# Patient Record
Sex: Female | Born: 1970 | Race: White | Hispanic: No | Smoking: Current every day smoker
Health system: Southern US, Community
[De-identification: ages and names within clinical notes are randomized; demographics above are authoritative.]

## PROBLEM LIST (undated history)

## (undated) DIAGNOSIS — J45909 Unspecified asthma, uncomplicated: Secondary | ICD-10-CM

## (undated) DIAGNOSIS — J449 Chronic obstructive pulmonary disease, unspecified: Secondary | ICD-10-CM

## (undated) HISTORY — PX: TUBAL LIGATION: SHX77

## (undated) HISTORY — PX: BACK SURGERY: SHX140

---

## 2001-05-16 ENCOUNTER — Emergency Department (HOSPITAL_COMMUNITY): Admission: EM | Admit: 2001-05-16 | Discharge: 2001-05-16 | Payer: Self-pay | Admitting: Emergency Medicine

## 2001-10-17 ENCOUNTER — Encounter: Payer: Self-pay | Admitting: Emergency Medicine

## 2001-10-17 ENCOUNTER — Emergency Department (HOSPITAL_COMMUNITY): Admission: EM | Admit: 2001-10-17 | Discharge: 2001-10-17 | Payer: Self-pay | Admitting: Emergency Medicine

## 2002-01-11 ENCOUNTER — Emergency Department (HOSPITAL_COMMUNITY): Admission: EM | Admit: 2002-01-11 | Discharge: 2002-01-11 | Payer: Self-pay | Admitting: Emergency Medicine

## 2002-01-11 ENCOUNTER — Encounter: Payer: Self-pay | Admitting: Emergency Medicine

## 2002-09-27 ENCOUNTER — Emergency Department (HOSPITAL_COMMUNITY): Admission: EM | Admit: 2002-09-27 | Discharge: 2002-09-27 | Payer: Self-pay | Admitting: Emergency Medicine

## 2003-04-10 ENCOUNTER — Emergency Department (HOSPITAL_COMMUNITY): Admission: EM | Admit: 2003-04-10 | Discharge: 2003-04-10 | Payer: Self-pay

## 2003-04-10 ENCOUNTER — Encounter: Payer: Self-pay | Admitting: Emergency Medicine

## 2003-06-16 ENCOUNTER — Emergency Department (HOSPITAL_COMMUNITY): Admission: EM | Admit: 2003-06-16 | Discharge: 2003-06-16 | Payer: Self-pay | Admitting: Emergency Medicine

## 2003-11-10 ENCOUNTER — Other Ambulatory Visit: Payer: Self-pay

## 2004-01-17 ENCOUNTER — Emergency Department (HOSPITAL_COMMUNITY): Admission: EM | Admit: 2004-01-17 | Discharge: 2004-01-18 | Payer: Self-pay | Admitting: Emergency Medicine

## 2004-01-26 ENCOUNTER — Emergency Department (HOSPITAL_COMMUNITY): Admission: EM | Admit: 2004-01-26 | Discharge: 2004-01-26 | Payer: Self-pay | Admitting: Emergency Medicine

## 2004-02-06 ENCOUNTER — Emergency Department (HOSPITAL_COMMUNITY): Admission: EM | Admit: 2004-02-06 | Discharge: 2004-02-06 | Payer: Self-pay | Admitting: Emergency Medicine

## 2004-08-05 ENCOUNTER — Emergency Department: Payer: Self-pay | Admitting: Emergency Medicine

## 2004-08-18 ENCOUNTER — Emergency Department: Payer: Self-pay | Admitting: Emergency Medicine

## 2004-08-25 ENCOUNTER — Emergency Department: Payer: Self-pay | Admitting: Emergency Medicine

## 2004-10-11 ENCOUNTER — Emergency Department (HOSPITAL_COMMUNITY): Admission: EM | Admit: 2004-10-11 | Discharge: 2004-10-11 | Payer: Self-pay | Admitting: *Deleted

## 2005-05-13 ENCOUNTER — Emergency Department: Payer: Self-pay | Admitting: Emergency Medicine

## 2005-10-01 ENCOUNTER — Emergency Department (HOSPITAL_COMMUNITY): Admission: EM | Admit: 2005-10-01 | Discharge: 2005-10-01 | Payer: Self-pay | Admitting: Emergency Medicine

## 2006-02-04 ENCOUNTER — Emergency Department: Payer: Self-pay | Admitting: Emergency Medicine

## 2006-02-04 ENCOUNTER — Other Ambulatory Visit: Payer: Self-pay

## 2006-02-08 ENCOUNTER — Ambulatory Visit: Payer: Self-pay | Admitting: Emergency Medicine

## 2006-02-08 ENCOUNTER — Emergency Department: Payer: Self-pay | Admitting: Emergency Medicine

## 2006-04-25 ENCOUNTER — Emergency Department: Payer: Self-pay | Admitting: Unknown Physician Specialty

## 2006-05-19 ENCOUNTER — Emergency Department: Payer: Self-pay | Admitting: Emergency Medicine

## 2006-08-28 ENCOUNTER — Emergency Department: Payer: Self-pay | Admitting: Emergency Medicine

## 2006-12-03 ENCOUNTER — Emergency Department: Payer: Self-pay | Admitting: Emergency Medicine

## 2007-06-08 ENCOUNTER — Emergency Department: Payer: Self-pay | Admitting: Emergency Medicine

## 2007-10-04 ENCOUNTER — Other Ambulatory Visit: Payer: Self-pay

## 2007-10-04 ENCOUNTER — Emergency Department: Payer: Self-pay | Admitting: Emergency Medicine

## 2008-04-19 ENCOUNTER — Emergency Department: Payer: Self-pay | Admitting: Emergency Medicine

## 2008-06-04 ENCOUNTER — Emergency Department: Payer: Self-pay | Admitting: Emergency Medicine

## 2008-07-18 ENCOUNTER — Emergency Department: Payer: Self-pay | Admitting: Internal Medicine

## 2008-12-02 ENCOUNTER — Emergency Department: Payer: Self-pay | Admitting: Emergency Medicine

## 2009-02-14 ENCOUNTER — Emergency Department: Payer: Self-pay | Admitting: Emergency Medicine

## 2009-05-17 ENCOUNTER — Emergency Department: Payer: Self-pay | Admitting: Emergency Medicine

## 2009-05-18 ENCOUNTER — Emergency Department: Payer: Self-pay | Admitting: Emergency Medicine

## 2009-06-23 ENCOUNTER — Emergency Department: Payer: Self-pay | Admitting: Internal Medicine

## 2009-09-01 ENCOUNTER — Emergency Department: Payer: Self-pay | Admitting: Emergency Medicine

## 2009-09-13 ENCOUNTER — Ambulatory Visit: Payer: Self-pay | Admitting: Family Medicine

## 2009-09-24 ENCOUNTER — Ambulatory Visit (HOSPITAL_COMMUNITY): Admission: RE | Admit: 2009-09-24 | Discharge: 2009-09-25 | Payer: Self-pay | Admitting: Neurosurgery

## 2009-12-19 ENCOUNTER — Emergency Department: Payer: Self-pay | Admitting: Emergency Medicine

## 2009-12-20 ENCOUNTER — Emergency Department: Payer: Self-pay | Admitting: Emergency Medicine

## 2010-04-05 ENCOUNTER — Emergency Department: Payer: Self-pay | Admitting: Emergency Medicine

## 2010-08-29 ENCOUNTER — Emergency Department: Payer: Self-pay | Admitting: Emergency Medicine

## 2010-11-10 LAB — CBC
HCT: 39.4 % (ref 36.0–46.0)
Hemoglobin: 13.6 g/dL (ref 12.0–15.0)
Platelets: 246 10*3/uL (ref 150–400)
RBC: 4.41 MIL/uL (ref 3.87–5.11)
RDW: 13.1 % (ref 11.5–15.5)
WBC: 7.1 10*3/uL (ref 4.0–10.5)

## 2010-11-10 LAB — URINALYSIS, ROUTINE W REFLEX MICROSCOPIC
Bilirubin Urine: NEGATIVE
Glucose, UA: NEGATIVE mg/dL

## 2010-11-10 LAB — BASIC METABOLIC PANEL
CO2: 30 mEq/L (ref 19–32)
Calcium: 8.8 mg/dL (ref 8.4–10.5)
Chloride: 105 mEq/L (ref 96–112)
GFR calc Af Amer: >60 mL/min (ref >60)
GFR calc non Af Amer: >60 mL/min (ref >60)
Glucose, Bld: 90 mg/dL (ref 70–99)
Potassium: 4.4 mEq/L (ref 3.5–5.1)
Sodium: 141 mEq/L (ref 135–145)

## 2010-11-10 LAB — URINE MICROSCOPIC-ADD ON

## 2010-11-29 ENCOUNTER — Emergency Department: Payer: Self-pay | Admitting: Emergency Medicine

## 2010-12-03 ENCOUNTER — Encounter: Payer: Self-pay | Admitting: Cardiovascular Disease

## 2011-03-01 ENCOUNTER — Emergency Department (HOSPITAL_COMMUNITY)
Admission: EM | Admit: 2011-03-01 | Discharge: 2011-03-01 | Disposition: A | Discharge status: Home / Self Care | Payer: BC Managed Care – HMO | Attending: Emergency Medicine | Admitting: Emergency Medicine

## 2011-03-01 DIAGNOSIS — M549 Dorsalgia, unspecified: Secondary | ICD-10-CM | POA: Insufficient documentation

## 2011-03-01 LAB — URINALYSIS, ROUTINE W REFLEX MICROSCOPIC
Hgb urine dipstick: NEGATIVE
Ketones, ur: NEGATIVE mg/dL
Leukocytes, UA: NEGATIVE
Protein, ur: NEGATIVE mg/dL

## 2011-06-23 ENCOUNTER — Emergency Department: Payer: Self-pay | Admitting: *Deleted

## 2011-10-04 ENCOUNTER — Emergency Department: Payer: Self-pay | Admitting: Emergency Medicine

## 2011-11-13 ENCOUNTER — Emergency Department: Payer: Self-pay | Admitting: Internal Medicine

## 2011-11-13 LAB — URINALYSIS, COMPLETE
Bilirubin,UR: NEGATIVE
Leukocyte Esterase: NEGATIVE
Ph: 6 (ref 4.5–8.0)
Protein: NEGATIVE
Squamous Epithelial: 1

## 2011-11-13 LAB — PREGNANCY, URINE: Pregnancy Test, Urine: NEGATIVE m[IU]/mL

## 2013-09-20 ENCOUNTER — Emergency Department: Payer: Self-pay | Admitting: Internal Medicine

## 2013-12-20 ENCOUNTER — Emergency Department: Payer: Self-pay | Admitting: Internal Medicine

## 2014-02-09 ENCOUNTER — Emergency Department: Payer: Self-pay | Admitting: Emergency Medicine

## 2014-05-30 ENCOUNTER — Emergency Department (HOSPITAL_COMMUNITY): Payer: No Typology Code available for payment source

## 2014-05-30 ENCOUNTER — Encounter (HOSPITAL_COMMUNITY): Payer: Self-pay | Admitting: Emergency Medicine

## 2014-05-30 ENCOUNTER — Emergency Department (HOSPITAL_COMMUNITY)
Admission: EM | Admit: 2014-05-30 | Discharge: 2014-05-30 | Disposition: A | Discharge status: Home / Self Care | Payer: No Typology Code available for payment source | Attending: Emergency Medicine | Admitting: Emergency Medicine

## 2014-05-30 DIAGNOSIS — J45909 Unspecified asthma, uncomplicated: Secondary | ICD-10-CM | POA: Diagnosis not present

## 2014-05-30 DIAGNOSIS — Z720 Tobacco use: Secondary | ICD-10-CM | POA: Diagnosis not present

## 2014-05-30 DIAGNOSIS — R0789 Other chest pain: Secondary | ICD-10-CM | POA: Diagnosis not present

## 2014-05-30 DIAGNOSIS — R079 Chest pain, unspecified: Secondary | ICD-10-CM | POA: Diagnosis present

## 2014-05-30 HISTORY — DX: Unspecified asthma, uncomplicated: J45.909

## 2014-05-30 LAB — CBC
HCT: 36.5 % (ref 36.0–46.0)
Hemoglobin: 12.7 g/dL (ref 12.0–15.0)
MCH: 30.5 pg (ref 26.0–34.0)
MCHC: 34.8 g/dL (ref 30.0–36.0)
MCV: 87.7 fL (ref 78.0–100.0)
Platelets: 305 10*3/uL (ref 150–400)
RBC: 4.16 MIL/uL (ref 3.87–5.11)
RDW: 12.3 % (ref 11.5–15.5)
WBC: 8.8 10*3/uL (ref 4.0–10.5)

## 2014-05-30 LAB — BASIC METABOLIC PANEL
Anion gap: 15 (ref 5–15)
BUN: 8 mg/dL (ref 6–23)
CALCIUM: 8.9 mg/dL (ref 8.4–10.5)
CO2: 24 meq/L (ref 19–32)
CREATININE: 0.75 mg/dL (ref 0.50–1.10)
Chloride: 101 mEq/L (ref 96–112)
GFR calc Af Amer: >90 mL/min (ref >90)
GFR calc non Af Amer: >90 mL/min (ref >90)
GLUCOSE: 103 mg/dL — AB (ref 70–99)
Potassium: 3.6 mEq/L — ABNORMAL LOW (ref 3.7–5.3)
SODIUM: 140 meq/L (ref 137–147)

## 2014-05-30 LAB — I-STAT TROPONIN, ED: TROPONIN I, POC: 0 ng/mL (ref 0.00–0.08)

## 2014-05-30 MED ORDER — HYDROCODONE-ACETAMINOPHEN 5-325 MG PO TABS: 1.0000 | ORAL_TABLET | ORAL | Status: DC | PRN

## 2014-05-30 MED ORDER — IBUPROFEN 800 MG PO TABS
800.0000 mg | ORAL_TABLET | Freq: Once | ORAL | Status: AC
  Administered 2014-05-30: 800 mg via ORAL
  Filled 2014-05-30: qty 1

## 2014-05-30 MED ORDER — IBUPROFEN 800 MG PO TABS: 800.0000 mg | ORAL_TABLET | Freq: Three times a day (TID) | ORAL | Status: DC | PRN

## 2014-05-30 NOTE — ED Notes (Signed)
Attempted to draw labs X2....  unsucessful.

## 2014-05-30 NOTE — ED Notes (Signed)
Pt verbalized understanding of discharge papers and no further questions.

## 2014-05-30 NOTE — ED Notes (Signed)
Pt. Reports intermittent mid chest pain with SOB /productive cough onset Monday this week , denies nausea or diaphoresis , mild SOB worse with exertion .

## 2014-05-30 NOTE — Discharge Instructions (Signed)

## 2014-05-30 NOTE — ED Notes (Signed)
Pt reports central cp that radiates to left breast. Pt states pain has been going on since the weekend but on Monday pain got worse. Pt denies any pain at this time but does state when the pain comes she gets sob. Pt reports she has been under a lot of stress lately.

## 2014-05-30 NOTE — ED Provider Notes (Signed)
TIME SEEN: 3:47 PM  CHIEF COMPLAINT: Chest pain  HPI: Patient is a 43 year old female with history of asthma and tobacco use who presents to the emergency department complaints of left-sided chest pain that she describes as a soreness. Is worse with palpation of her chest wall. It is not exertional despite nursing notes or pleuritic. She states she has had some mild shortness of breath but thinks this is attributed to increased stress as she has a son in the ICU. Denies any nausea, vomiting, diaphoresis or dizziness. No prior history of hypertension, diabetes, hyperlipidemia, CAD. Patient reports her pain has been constant but waxing and waning since yesterday. No family history of premature CAD. No history of PE or DVT, recent prolonged immobilization such as hospitalization or long flight, fracture, surgery, trauma, exogenous hormone use.  ROS: See HPI Constitutional: no fever  Eyes: no drainage  ENT: no runny nose   Cardiovascular:   chest pain  Resp: SOB  GI: no vomiting GU: no dysuria Integumentary: no rash  Allergy: no hives  Musculoskeletal: no leg swelling  Neurological: no slurred speech ROS otherwise negative  PAST MEDICAL HISTORY/PAST SURGICAL HISTORY:  Past Medical History  Diagnosis Date  . Asthma     MEDICATIONS:  Prior to Admission medications   Not on File    ALLERGIES:  No Known Allergies  SOCIAL HISTORY:  History  Substance Use Topics  . Smoking status: Current Every Day Smoker  . Smokeless tobacco: Not on file  . Alcohol Use: Yes    FAMILY HISTORY: No family history on file.  EXAM: BP 128/69  Pulse 83  Temp(Src) 97.7 F (36.5 C) (Oral)  Resp 20  Wt 155 lb (70.308 kg)  SpO2 100%  LMP 05/07/2014 CONSTITUTIONAL: Alert and oriented and responds appropriately to questions. Well-appearing; well-nourished, pleasant, smiling HEAD: Normocephalic EYES: Conjunctivae clear, PERRL ENT: normal nose; no rhinorrhea; moist mucous membranes; pharynx without  lesions noted NECK: Supple, no meningismus, no LAD  CARD: RRR; S1 and S2 appreciated; no murmurs, no clicks, no rubs, no gallops RESP: Normal chest excursion without splinting or tachypnea; breath sounds clear and equal bilaterally; no wheezes, no rhonchi, no rales, left chest wall is tender to palpation without crepitus or ecchymosis or deformity, no flail chest, no hypoxia or respiratory distress ABD/GI: Normal bowel sounds; non-distended; soft, non-tender, no rebound, no guarding BACK:  The back appears normal and is non-tender to palpation, there is no CVA tenderness EXT: Normal ROM in all joints; non-tender to palpation; no edema; normal capillary refill; no cyanosis; no calf swelling or tenderness    SKIN: Normal color for age and race; warm NEURO: Moves all extremities equally PSYCH: The patient's mood and manner are appropriate. Grooming and personal hygiene are appropriate.  MEDICAL DECISION MAKING: Patient here with chest wall pain. Pain is not exertional or pleuritic. Her only risk factor for ACS is tobacco use. She is PERC negative.  Pain is been constant since yesterday and she has a negative troponin. EKG shows no ischemic changes. Chest x-ray clear. I feel she is safe to be discharged home with ibuprofen and Vicodin. Discussed return precautions. She verbalizes understanding and is comfortable with plan. I am not concerned for ACS, pulmonary embolus, dissection.    EKG Interpretation  Date/Time:  Wednesday May 30 2014 00:17:18 EDT Ventricular Rate:  73 PR Interval:  136 QRS Duration: 96 QT Interval:  394 QTC Calculation: 434 R Axis:   64 Text Interpretation:  Normal sinus rhythm Normal ECG Confirmed by  Calina Patrie,  DO, Emil Weigold ([id number]) on 05/30/2014 3:36:19 AM           Nadine, DO 05/30/14 [phone]

## 2014-10-27 ENCOUNTER — Emergency Department: Payer: Self-pay | Admitting: Emergency Medicine

## 2015-02-08 ENCOUNTER — Emergency Department
Admission: EM | Admit: 2015-02-08 | Discharge: 2015-02-08 | Disposition: A | Discharge status: Home / Self Care | Payer: BLUE CROSS/BLUE SHIELD | Attending: Emergency Medicine | Admitting: Emergency Medicine

## 2015-02-08 ENCOUNTER — Encounter: Payer: Self-pay | Admitting: Emergency Medicine

## 2015-02-08 DIAGNOSIS — M25511 Pain in right shoulder: Secondary | ICD-10-CM | POA: Diagnosis not present

## 2015-02-08 DIAGNOSIS — Z720 Tobacco use: Secondary | ICD-10-CM | POA: Insufficient documentation

## 2015-02-08 DIAGNOSIS — R52 Pain, unspecified: Secondary | ICD-10-CM | POA: Diagnosis present

## 2015-02-08 DIAGNOSIS — G44209 Tension-type headache, unspecified, not intractable: Secondary | ICD-10-CM | POA: Diagnosis not present

## 2015-02-08 DIAGNOSIS — M542 Cervicalgia: Secondary | ICD-10-CM | POA: Insufficient documentation

## 2015-02-08 DIAGNOSIS — M25512 Pain in left shoulder: Secondary | ICD-10-CM | POA: Diagnosis not present

## 2015-02-08 HISTORY — DX: Chronic obstructive pulmonary disease, unspecified: J44.9

## 2015-02-08 MED ORDER — BUTALBITAL-APAP-CAFFEINE 50-325-40 MG PO TABS: 1.0000 | ORAL_TABLET | Freq: Four times a day (QID) | ORAL | Status: AC | PRN

## 2015-02-08 NOTE — Discharge Instructions (Signed)
Tension Headache °A tension headache is pain, pressure, or aching felt over the front and sides of the head. Tension headaches often come after stress, feeling worried (anxiety), or feeling sad or down for a while (depressed). °HOME CARE °· Only take medicine as told by your doctor. °· Lie down in a dark, quiet room when you have a headache. °· Keep a journal to find out if certain things bring on headaches. For example, write down: °¨ What you eat and drink. °¨ How much sleep you get. °¨ Any change to your diet or medicines. °· Relax by getting a massage or doing other relaxing activities. °· Put ice or heat packs on the head and neck area as told by your doctor. °· Lessen stress. °· Sit up straight. Do not tighten (tense) your muscles. °· Quit smoking if you smoke. °· Lessen how much alcohol you drink. °· Lessen how much caffeine you drink, or stop drinking caffeine. °· Eat and exercise regularly. °· Get enough sleep. °· Avoid using too much pain medicine. °GET HELP RIGHT AWAY IF:  °· Your headache becomes really bad. °· You have a fever. °· You have a stiff neck. °· You have trouble seeing. °· Your muscles are weak, or you lose muscle control. °· You lose your balance or have trouble walking. °· You feel like you will pass out (faint), or you pass out. °· You have really bad symptoms that are different than your first symptoms. °· You have problems with the medicines given to you by your doctor. °· Your medicines do not work. °· Your headache feels different than the other headaches. °· You feel sick to your stomach (nauseous) or throw up (vomit). °MAKE SURE YOU:  °· Understand these instructions. °· Will watch your condition. °· Will get help right away if you are not doing well or get worse. °Document Released: 11/04/2009 Document Revised: 11/02/2011 Document Reviewed: 07/31/2011 °[employer]® Patient Information ©2015 [employer]. This information is not intended to replace advice given to you by your health  care provider. Make sure you discuss any questions you have with your health care provider. ° °

## 2015-02-08 NOTE — ED Provider Notes (Signed)
[facility] Emergency Department Provider Note ____________________________________________  Time seen: Approximately 6:56 PM  I have reviewed the triage vital signs and the nursing notes.   HISTORY  Chief Complaint Joint Pain and Torticollis   HPI Monica Pham is a 44 y.o. female who presents to the emergency department for intermittent neck pain that radiates up into the back of her head. She states that this gets much worse when she is under stress. She reports that she has been under a significant amount of stress recently due to family and social issues. She states that ibuprofen does not relieve the pain.   Past Medical History  Diagnosis Date  . Asthma   . COPD (chronic obstructive pulmonary disease)     There are no active problems to display for this patient.   History reviewed. No pertinent past surgical history.  Current Outpatient Rx  Name  Route  Sig  Dispense  Refill  . butalbital-acetaminophen-caffeine (FIORICET) 50-325-40 MG per tablet   Oral   Take 1-2 tablets by mouth every 6 (six) hours as needed for headache.   20 tablet   0   . HYDROcodone-acetaminophen (NORCO/VICODIN) 5-325 MG per tablet   Oral   Take 1 tablet by mouth every 4 (four) hours as needed.   15 tablet   0   . ibuprofen (ADVIL,MOTRIN) 800 MG tablet   Oral   Take 1 tablet (800 mg total) by mouth every 8 (eight) hours as needed for mild pain.   30 tablet   0     Allergies Review of patient's allergies indicates no known allergies.  History reviewed. No pertinent family history.  Social History History  Substance Use Topics  . Smoking status: Current Every Day Smoker  . Smokeless tobacco: Not on file  . Alcohol Use: Yes    Review of Systems Constitutional: No recent illness. Eyes: No visual changes. ENT: No sore throat. Cardiovascular: Denies chest pain or palpitations. Respiratory: Denies shortness of breath. Gastrointestinal: No abdominal  pain.  Genitourinary: Negative for dysuria. Musculoskeletal: Pain in neck, shoulders, with radiation into the back of her head Skin: Negative for rash. Neurological: Negative for headaches, focal weakness or numbness. 10-point ROS otherwise negative.  ____________________________________________   PHYSICAL EXAM:  VITAL SIGNS: ED Triage Vitals  Enc Vitals Group     BP 02/08/15 1723 129/79 mmHg     Pulse Rate 02/08/15 1723 72     Resp 02/08/15 1723 20     Temp 02/08/15 1723 98.1 F (36.7 C)     Temp Source 02/08/15 1723 Oral     SpO2 02/08/15 1723 99 %     Weight 02/08/15 1723 160 lb (72.576 kg)     Height 02/08/15 1723 5\' 3"  (1.6 m)     Head Cir --      Peak Flow --      Pain Score 02/08/15 1723 4     Pain Loc --      Pain Edu? --      Excl. in [place]? --     Constitutional: Alert and oriented. Well appearing and in no acute distress. Eyes: Conjunctivae are normal. EOMI. Head: Atraumatic. Nose: No congestion/rhinnorhea. Neck: No stridor.  Respiratory: Normal respiratory effort.   Musculoskeletal: Muscle tension noted on palpation of posterior neck and shoulders. Neurologic:  Normal speech and language. No gross focal neurologic deficits are appreciated. Speech is normal. No gait instability. Skin:  Skin is warm, dry and intact. Atraumatic. Psychiatric: Mood and  affect are normal. Speech and behavior are normal.  ____________________________________________   LABS (all labs ordered are listed, but only abnormal results are displayed)  Labs Reviewed - No data to display ____________________________________________  RADIOLOGY  Not indicated ____________________________________________   PROCEDURES  Procedure(s) performed: None   ____________________________________________   INITIAL IMPRESSION / ASSESSMENT AND PLAN / ED COURSE  Pertinent labs & imaging results that were available during my care of the patient were reviewed by me and considered in my medical  decision making (see chart for details).  Patient was strongly advised to establish a primary care provider. She was advised to return to the emergency department for symptoms that change or worsen if she is unable to schedule an appointment. ____________________________________________   FINAL CLINICAL IMPRESSION(S) / ED DIAGNOSES  Final diagnoses:  Muscle tension headache       Victorino Dike, FNP 02/08/15 1858  Delman Kitten, MD 02/10/15 [phone]

## 2015-02-08 NOTE — ED Notes (Signed)
States she had been under a lot of stress for several mos..having pain to back of neck and head   Min to no relief with OTC meds

## 2015-02-08 NOTE — ED Notes (Signed)
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

## 2015-02-14 ENCOUNTER — Encounter: Payer: Self-pay | Admitting: *Deleted

## 2015-02-14 ENCOUNTER — Emergency Department: Payer: BLUE CROSS/BLUE SHIELD

## 2015-02-14 ENCOUNTER — Emergency Department
Admission: EM | Admit: 2015-02-14 | Discharge: 2015-02-14 | Disposition: A | Discharge status: Home / Self Care | Payer: BLUE CROSS/BLUE SHIELD | Attending: Emergency Medicine | Admitting: Emergency Medicine

## 2015-02-14 DIAGNOSIS — M67472 Ganglion, left ankle and foot: Secondary | ICD-10-CM | POA: Diagnosis not present

## 2015-02-14 DIAGNOSIS — Z720 Tobacco use: Secondary | ICD-10-CM | POA: Insufficient documentation

## 2015-02-14 DIAGNOSIS — M79672 Pain in left foot: Secondary | ICD-10-CM | POA: Diagnosis present

## 2015-02-14 MED ORDER — NAPROXEN 500 MG PO TABS: 500.0000 mg | ORAL_TABLET | Freq: Two times a day (BID) | ORAL | Status: DC

## 2015-02-14 NOTE — ED Notes (Signed)
Pt alert and oriented X4, active, cooperative, pt in NAD. RR even and unlabored, color WNL.  Pt informed to return if any life threatening symptoms occur.   

## 2015-02-14 NOTE — ED Notes (Signed)
Pt complains of left foot pain , started today, pt denies injury

## 2015-02-14 NOTE — ED Provider Notes (Signed)
[facility] Emergency Department Provider Note  ____________________________________________  Time seen: Approximately 3:47 PM  I have reviewed the triage vital signs and the nursing notes.   HISTORY  Chief Complaint Foot Pain    HPI Monica Pham is a 44 y.o. female complaining of acute onset of left dorsal foot pain while at work today. Patient stated as removing shoes she was to palpate a small papular lesion on the skin on the lateral aspect of the fifth metatarsal. Patient states she put pressure to the area sharp pain radiates across the foot. Patient denies any provocative incident of increased physical activity. 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 No positive measures taken today. Patient rates the pain as a 1/10 until pressures applied to it and which didn't rates it as a 5/10. Patient denies loss sensation.   Past Medical History  Diagnosis Date  . Asthma   . COPD (chronic obstructive pulmonary disease)     There are no active problems to display for this patient.   History reviewed. No pertinent past surgical history.  Current Outpatient Rx  Name  Route  Sig  Dispense  Refill  . butalbital-acetaminophen-caffeine (FIORICET) 50-325-40 MG per tablet   Oral   Take 1-2 tablets by mouth every 6 (six) hours as needed for headache.   20 tablet   0   . HYDROcodone-acetaminophen (NORCO/VICODIN) 5-325 MG per tablet   Oral   Take 1 tablet by mouth every 4 (four) hours as needed.   15 tablet   0   . ibuprofen (ADVIL,MOTRIN) 800 MG tablet   Oral   Take 1 tablet (800 mg total) by mouth every 8 (eight) hours as needed for mild pain.   30 tablet   0     Allergies Review of patient's allergies indicates no known allergies.  No family history on file.  Social History History  Substance Use Topics  . Smoking status: Current Every Day Smoker -- 0.50 packs/day  . Smokeless tobacco: Not on file  . Alcohol Use: Yes     Review of Systems Constitutional: No fever/chills Eyes: No visual changes. ENT: No sore throat. Cardiovascular: Denies chest pain. Respiratory: Denies shortness of breath. Gastrointestinal: No abdominal pain.  No nausea, no vomiting.  No diarrhea.  No constipation. Genitourinary: Negative for dysuria. Musculoskeletal: Left foot pain Skin: Negative for rash. Neurological: Negative for headaches, focal weakness or numbness. 10-point ROS otherwise negative.  ____________________________________________   PHYSICAL EXAM:  VITAL SIGNS: ED Triage Vitals  Enc Vitals Group     BP 02/14/15 1530 120/66 mmHg     Pulse Rate 02/14/15 1530 98     Resp --      Temp 02/14/15 1530 98.3 F (36.8 C)     Temp src --      SpO2 02/14/15 1530 98 %     Weight 02/14/15 1530 160 lb (72.576 kg)     Height 02/14/15 1530 5\' 3"  (1.6 m)     Head Cir --      Peak Flow --      Pain Score 02/14/15 1531 1     Pain Loc --      Pain Edu? --      Excl. in [place]? --     Constitutional: Alert and oriented. Well appearing and in no acute distress. Eyes: Conjunctivae are normal. PERRL. EOMI. Head: Atraumatic. Nose: No congestion/rhinnorhea. Mouth/Throat: Mucous membranes are moist.  Oropharynx non-erythematous. Neck: No stridor. No deformity  for nuchal range of motion. Hematological/Lymphatic/Immunilogical: No cervical lymphadenopathy. Cardiovascular: Normal rate, regular rhythm. Grossly normal heart sounds.  Good peripheral circulation. Respiratory: Normal respiratory effort.  No retractions. Lungs CTAB. Gastrointestinal: Soft and nontender. No distention. No abdominal bruits. No CVA tenderness. Musculoskeletal: No obvious deformity or edema to the left foot. Palpable papular lesion dorsal aspect of the fifth metatarsal head. Guarding and complaining of increased pain with palpation of this lesion. Neurologic:  Normal speech and language. No gross focal neurologic deficits are appreciated. Speech is  normal. No gait instability. Skin:  Skin is warm, dry and intact. No rash noted. Psychiatric: Mood and affect are normal. Speech and behavior are normal.  ____________________________________________   LABS (all labs ordered are listed, but only abnormal results are displayed)  Labs Reviewed - No data to display ____________________________________________  EKG   ____________________________________________  RADIOLOGY  I, Sable Feil, personally viewed and evaluated these images as part of my medical decision making.  No acute findings____________________________________________   PROCEDURES  Procedure(s) performed: None  Critical Care performed: No  ____________________________________________   INITIAL IMPRESSION / ASSESSMENT AND PLAN / ED COURSE  Pertinent labs & imaging results that were available during my care of the patient were reviewed by me and considered in my medical decision making (see chart for details).  Smallest cystic mass dorsal aspect of left foot. Patient advised to purchase over-the-counter doughnut cushion to place over the area. Given a prescription for naproxen. And advises there is no improvement worse or complaint follow-up podiatry. ____________________________________________   FINAL CLINICAL IMPRESSION(S) / ED DIAGNOSES  Final diagnoses:  Ganglion cyst of left foot      Sable Feil, PA-C 02/14/15 1726  Orbie Pyo, MD 02/14/15 [phone]

## 2015-02-14 NOTE — ED Notes (Signed)
"  knot" to outer left foot, pt noticed while at work, denies injyr.

## 2015-02-14 NOTE — Discharge Instructions (Signed)
Advised OTC Donut cushion and follow up with Podiatry clinic if no improvement of worsen of complian.

## 2015-02-25 ENCOUNTER — Emergency Department: Payer: BLUE CROSS/BLUE SHIELD

## 2015-02-25 ENCOUNTER — Encounter: Payer: Self-pay | Admitting: Emergency Medicine

## 2015-02-25 ENCOUNTER — Emergency Department
Admission: EM | Admit: 2015-02-25 | Discharge: 2015-02-25 | Disposition: A | Discharge status: Home / Self Care | Payer: BLUE CROSS/BLUE SHIELD | Attending: Emergency Medicine | Admitting: Emergency Medicine

## 2015-02-25 DIAGNOSIS — Z792 Long term (current) use of antibiotics: Secondary | ICD-10-CM | POA: Insufficient documentation

## 2015-02-25 DIAGNOSIS — Z720 Tobacco use: Secondary | ICD-10-CM | POA: Diagnosis not present

## 2015-02-25 DIAGNOSIS — R102 Pelvic and perineal pain: Secondary | ICD-10-CM | POA: Diagnosis present

## 2015-02-25 DIAGNOSIS — Z3202 Encounter for pregnancy test, result negative: Secondary | ICD-10-CM | POA: Insufficient documentation

## 2015-02-25 DIAGNOSIS — Z79899 Other long term (current) drug therapy: Secondary | ICD-10-CM | POA: Diagnosis not present

## 2015-02-25 DIAGNOSIS — K5792 Diverticulitis of intestine, part unspecified, without perforation or abscess without bleeding: Secondary | ICD-10-CM | POA: Insufficient documentation

## 2015-02-25 LAB — COMPREHENSIVE METABOLIC PANEL
ALT: 12 U/L — AB (ref 14–54)
ANION GAP: 9 (ref 5–15)
AST: 15 U/L (ref 15–41)
Albumin: 4.1 g/dL (ref 3.5–5.0)
Alkaline Phosphatase: 63 U/L (ref 38–126)
BUN: 7 mg/dL (ref 6–20)
CO2: 24 mmol/L (ref 22–32)
Calcium: 8.3 mg/dL — ABNORMAL LOW (ref 8.9–10.3)
Chloride: 105 mmol/L (ref 101–111)
Creatinine, Ser: 0.79 mg/dL (ref 0.44–1.00)
GFR calc Af Amer: >60 mL/min (ref >60)
GFR calc non Af Amer: >60 mL/min (ref >60)
Glucose, Bld: 108 mg/dL — ABNORMAL HIGH (ref 65–99)
POTASSIUM: 3.9 mmol/L (ref 3.5–5.1)
SODIUM: 138 mmol/L (ref 135–145)
Total Bilirubin: 0.5 mg/dL (ref 0.3–1.2)
Total Protein: 7.6 g/dL (ref 6.5–8.1)

## 2015-02-25 LAB — CBC WITH DIFFERENTIAL/PLATELET
BASOS PCT: 0 %
Basophils Absolute: 0.1 10*3/uL (ref 0–0.1)
EOS PCT: 1 %
Eosinophils Absolute: 0.2 10*3/uL (ref 0–0.7)
HEMATOCRIT: 38.1 % (ref 35.0–47.0)
Hemoglobin: 12.6 g/dL (ref 12.0–16.0)
Lymphocytes Relative: 14 %
Lymphs Abs: 2.4 10*3/uL (ref 1.0–3.6)
MCH: 29.2 pg (ref 26.0–34.0)
MCHC: 33.2 g/dL (ref 32.0–36.0)
MCV: 88 fL (ref 80.0–100.0)
MONO ABS: 1.4 10*3/uL — AB (ref 0.2–0.9)
MONOS PCT: 8 %
Neutro Abs: 12.7 10*3/uL — ABNORMAL HIGH (ref 1.4–6.5)
Neutrophils Relative %: 77 %
PLATELETS: 293 10*3/uL (ref 150–440)
RBC: 4.33 MIL/uL (ref 3.80–5.20)
RDW: 14 % (ref 11.5–14.5)
WBC: 16.7 10*3/uL — ABNORMAL HIGH (ref 3.6–11.0)

## 2015-02-25 LAB — CHLAMYDIA/NGC RT PCR (ARMC ONLY)
CHLAMYDIA TR: NOT DETECTED
N GONORRHOEAE: NOT DETECTED

## 2015-02-25 LAB — URINALYSIS COMPLETE WITH MICROSCOPIC (ARMC ONLY)
Bacteria, UA: NONE SEEN
Bilirubin Urine: NEGATIVE
Glucose, UA: NEGATIVE mg/dL
Hgb urine dipstick: NEGATIVE
KETONES UR: NEGATIVE mg/dL
Leukocytes, UA: NEGATIVE
Nitrite: NEGATIVE
PH: 6 (ref 5.0–8.0)
Protein, ur: NEGATIVE mg/dL
Specific Gravity, Urine: 1.009 (ref 1.005–1.030)

## 2015-02-25 LAB — WET PREP, GENITAL
CLUE CELLS WET PREP: NONE SEEN
Trich, Wet Prep: NONE SEEN
YEAST WET PREP: NONE SEEN

## 2015-02-25 LAB — POCT PREGNANCY, URINE: Preg Test, Ur: NEGATIVE

## 2015-02-25 LAB — PREGNANCY, URINE: Preg Test, Ur: NEGATIVE

## 2015-02-25 LAB — LIPASE, BLOOD: LIPASE: 39 U/L (ref 22–51)

## 2015-02-25 MED ORDER — ONDANSETRON HCL 4 MG PO TABS: ORAL_TABLET | ORAL | Status: DC

## 2015-02-25 MED ORDER — CIPROFLOXACIN HCL 500 MG PO TABS: 500.0000 mg | ORAL_TABLET | Freq: Two times a day (BID) | ORAL | Status: AC

## 2015-02-25 MED ORDER — IOHEXOL 300 MG/ML  SOLN
100.0000 mL | Freq: Once | INTRAMUSCULAR | Status: AC | PRN
  Administered 2015-02-25: 100 mL via INTRAVENOUS

## 2015-02-25 MED ORDER — METRONIDAZOLE 500 MG PO TABS
ORAL_TABLET | ORAL | Status: AC
  Administered 2015-02-25: 500 mg via ORAL
  Filled 2015-02-25: qty 1

## 2015-02-25 MED ORDER — METRONIDAZOLE 500 MG PO TABS: 500.0000 mg | ORAL_TABLET | Freq: Three times a day (TID) | ORAL | Status: AC

## 2015-02-25 MED ORDER — METRONIDAZOLE 500 MG PO TABS
500.0000 mg | ORAL_TABLET | Freq: Once | ORAL | Status: AC
  Administered 2015-02-25: 500 mg via ORAL

## 2015-02-25 MED ORDER — OXYCODONE-ACETAMINOPHEN 5-325 MG PO TABS
2.0000 | ORAL_TABLET | Freq: Once | ORAL | Status: AC
  Administered 2015-02-25: 2 via ORAL

## 2015-02-25 MED ORDER — OXYCODONE-ACETAMINOPHEN 5-325 MG PO TABS: 1.0000 | ORAL_TABLET | ORAL | Status: DC | PRN

## 2015-02-25 MED ORDER — CIPROFLOXACIN HCL 500 MG PO TABS
500.0000 mg | ORAL_TABLET | ORAL | Status: AC
  Administered 2015-02-25: 500 mg via ORAL

## 2015-02-25 MED ORDER — IOHEXOL 240 MG/ML SOLN
50.0000 mL | Freq: Once | INTRAMUSCULAR | Status: AC | PRN
  Administered 2015-02-25: 25 mL via INTRAVENOUS

## 2015-02-25 MED ORDER — ONDANSETRON 4 MG PO TBDP
ORAL_TABLET | ORAL | Status: AC
  Administered 2015-02-25: 4 mg via ORAL
  Filled 2015-02-25: qty 1

## 2015-02-25 MED ORDER — ONDANSETRON 4 MG PO TBDP
4.0000 mg | ORAL_TABLET | Freq: Once | ORAL | Status: AC
  Administered 2015-02-25: 4 mg via ORAL

## 2015-02-25 MED ORDER — CIPROFLOXACIN HCL 500 MG PO TABS
ORAL_TABLET | ORAL | Status: AC
  Administered 2015-02-25: 500 mg via ORAL
  Filled 2015-02-25: qty 1

## 2015-02-25 MED ORDER — OXYCODONE-ACETAMINOPHEN 5-325 MG PO TABS
ORAL_TABLET | ORAL | Status: AC
  Administered 2015-02-25: 2 via ORAL
  Filled 2015-02-25: qty 2

## 2015-02-25 NOTE — ED Notes (Signed)
Patient transported to Ultrasound 

## 2015-02-25 NOTE — ED Notes (Addendum)
Pt presents to ER stating suprapubic pain that started last night and increased throughout the night. Pt tearful in triage. Pt denies vaginal bleeding or discharge, states some dysuria.

## 2015-02-25 NOTE — ED Provider Notes (Signed)
[facility] Emergency Department Provider Note  ____________________________________________  Time seen: Approximately 7:29 AM  I have reviewed the triage vital signs and the nursing notes.   HISTORY  Chief Complaint Pelvic Pain    HPI Monica Pham is a 44 y.o. female with no relevant past medical history other than a history of a tubal ligation who presents with slow onset, gradually worsening, constant pelvic pain for about 12 hours.  The intensity waxes and wanes between mild and severe.  It is better if she curls up into the fetal position and worse if she lies down flat or moves.  He describes it as a cramping pressure that "is pushing on my uterus".  She states she has never had this kind of discomfort before, but it is most consistent with initial cramps.  She states that she is due for her.  And that she "had two last month ".  She has had a tubal ligation but no other abdominal surgeries.  She denies nausea, vomiting, shortness of breath, chest pain, and dysuria.   Past Medical History  Diagnosis Date  . Asthma   . COPD (chronic obstructive pulmonary disease)     There are no active problems to display for this patient.   Past Surgical History  Procedure Laterality Date  . Back surgery    . Tubal ligation      Current Outpatient Rx  Name  Route  Sig  Dispense  Refill  . albuterol (PROVENTIL HFA;VENTOLIN HFA) 108 (90 BASE) MCG/ACT inhaler   Inhalation   Inhale 2 puffs into the lungs every 6 (six) hours as needed for shortness of breath.         . butalbital-acetaminophen-caffeine (FIORICET) 50-325-40 MG per tablet   Oral   Take 1-2 tablets by mouth every 6 (six) hours as needed for headache.   20 tablet   0   . ciprofloxacin (CIPRO) 500 MG tablet   Oral   Take 1 tablet (500 mg total) by mouth 2 (two) times daily.   20 tablet   0   . HYDROcodone-acetaminophen (NORCO/VICODIN) 5-325 MG per tablet   Oral   Take 1 tablet by mouth  every 4 (four) hours as needed.   15 tablet   0   . ibuprofen (ADVIL,MOTRIN) 800 MG tablet   Oral   Take 1 tablet (800 mg total) by mouth every 8 (eight) hours as needed for mild pain.   30 tablet   0   . metroNIDAZOLE (FLAGYL) 500 MG tablet   Oral   Take 1 tablet (500 mg total) by mouth 3 (three) times daily.   30 tablet   0   . naproxen (NAPROSYN) 500 MG tablet   Oral   Take 1 tablet (500 mg total) by mouth 2 (two) times daily with a meal.   20 tablet   00   . ondansetron (ZOFRAN) 4 MG tablet      Take 1-2 tabs by mouth every 8 hours as needed for nausea/vomiting   30 tablet   0   . oxyCODONE-acetaminophen (ROXICET) 5-325 MG per tablet   Oral   Take 1-2 tablets by mouth every 4 (four) hours as needed for severe pain.   20 tablet   0     Allergies Review of patient's allergies indicates no known allergies.  History reviewed. No pertinent family history.  Social History History  Substance Use Topics  . Smoking status: Current Every Day Smoker -- 0.50 packs/day  .  Smokeless tobacco: Never Used  . Alcohol Use: No    Review of Systems Constitutional: No fever/chills Eyes: No visual changes. ENT: No sore throat. Cardiovascular: Denies chest pain. Respiratory: Denies shortness of breath. Gastrointestinal: Cramping, pressure-like lower abdominal pain.  No nausea, no vomiting.   Genitourinary: Negative for dysuria. Musculoskeletal: Negative for back pain. Skin: Negative for rash. Neurological: Negative for headaches, focal weakness or numbness.  10-point ROS otherwise negative.  ____________________________________________   PHYSICAL EXAM:  VITAL SIGNS: ED Triage Vitals  Enc Vitals Group     BP 02/25/15 0502 141/97 mmHg     Pulse Rate 02/25/15 0502 94     Resp 02/25/15 0502 20     Temp 02/25/15 0502 98.3 F (36.8 C)     Temp Source 02/25/15 0502 Oral     SpO2 02/25/15 0502 99 %     Weight 02/25/15 0502 165 lb (74.844 kg)     Height 02/25/15 0502  5\' 3"  (1.6 m)     Head Cir --      Peak Flow --      Pain Score 02/25/15 0503 10     Pain Loc --      Pain Edu? --      Excl. in [place]? --     Constitutional: Alert and oriented. Well appearing and in no acute distress.  Sleeping initially upon my arrival to the exam room Eyes: Conjunctivae are normal. PERRL. EOMI. Head: Atraumatic. Nose: No congestion/rhinnorhea. Mouth/Throat: Mucous membranes are moist.  Oropharynx non-erythematous. Neck: No stridor.   Cardiovascular: Normal rate, regular rhythm. Grossly normal heart sounds.  Good peripheral circulation. Respiratory: Normal respiratory effort.  No retractions. Lungs CTAB. Gastrointestinal: Soft.  Severe tenderness to palpation in suprapubic region.  Mild tenderness in both the left lower and the right lower quadrant but it is causing the suprapubic region to hurt; the pain is not localized laterally.  No rebound tenderness.  Voluntary guarding.  No distention. No abdominal bruits. No CVA tenderness. Genitourinary: Normal external exam.  Severe tenderness upon speculum exam.  Moderate amount of whitish gray discharge.  Difficult to appreciate complete cervix due to patient's discomfort.  Severe tenderness/cervical motion tenderness on bimanual exam.  No lateral pain to suggest adnexal origin. Musculoskeletal: No lower extremity tenderness nor edema.  No joint effusions. Neurologic:  Normal speech and language. No gross focal neurologic deficits are appreciated. Speech is normal. Skin:  Skin is warm, dry and intact. No rash noted. Psychiatric: Mood and affect are normal. Speech and behavior are normal.  ____________________________________________   LABS (all labs ordered are listed, but only abnormal results are displayed)  Labs Reviewed  WET PREP, GENITAL - Abnormal; Notable for the following:    WBC, Wet Prep HPF POC FEW (*)    All other components within normal limits  COMPREHENSIVE METABOLIC PANEL - Abnormal; Notable for the  following:    Glucose, Bld 108 (*)    Calcium 8.3 (*)    ALT 12 (*)    All other components within normal limits  CBC WITH DIFFERENTIAL/PLATELET - Abnormal; Notable for the following:    WBC 16.7 (*)    Neutro Abs 12.7 (*)    Monocytes Absolute 1.4 (*)    All other components within normal limits  URINALYSIS COMPLETEWITH MICROSCOPIC ([facility] ONLY) - Abnormal; Notable for the following:    Color, Urine YELLOW (*)    APPearance CLEAR (*)    Squamous Epithelial / LPF 0-5 (*)    All other components  within normal limits  CHLAMYDIA/NGC RT PCR ([facility] ONLY)  LIPASE, BLOOD  PREGNANCY, URINE   ____________________________________________  EKG  Not indicated ____________________________________________  RADIOLOGY  US Transvaginal Non-ob  02/25/2015   CLINICAL DATA:  Severe pelvic pain.  EXAM: TRANSABDOMINAL AND TRANSVAGINAL ULTRASOUND OF PELVIS  TECHNIQUE: Both transabdominal and transvaginal ultrasound examinations of the pelvis were performed. Transabdominal technique was performed for global imaging of the pelvis including uterus, ovaries, adnexal regions, and pelvic cul-de-sac. It was necessary to proceed with endovaginal exam following the transabdominal exam to visualize the endometrium and ovaries.  COMPARISON:  None  FINDINGS: Uterus  Measurements: 9.6 x 4.4 x 4.9 cm. No fibroids or other mass visualized.  Endometrium  Thickness: 13 mm in thickness.  No focal abnormality visualized.  Right ovary  Measurements: 2.4 x 2.5 x 1.8 cm. Normal appearance/no adnexal mass.  Left ovary  Measurements: 2.5 x 1.4 x 3.1 cm. Normal appearance/no adnexal mass.  Other findings  Small amount of free fluid in the pelvis.  IMPRESSION: No acute findings. Small amount of free fluid in the pelvis likely physiologic.   Electronically Signed   By: Rolm Baptise M.D.   On: 02/25/2015 08:48   US Pelvis Complete  02/25/2015   CLINICAL DATA:  Severe pelvic pain.  EXAM: TRANSABDOMINAL AND TRANSVAGINAL ULTRASOUND  OF PELVIS  TECHNIQUE: Both transabdominal and transvaginal ultrasound examinations of the pelvis were performed. Transabdominal technique was performed for global imaging of the pelvis including uterus, ovaries, adnexal regions, and pelvic cul-de-sac. It was necessary to proceed with endovaginal exam following the transabdominal exam to visualize the endometrium and ovaries.  COMPARISON:  None  FINDINGS: Uterus  Measurements: 9.6 x 4.4 x 4.9 cm. No fibroids or other mass visualized.  Endometrium  Thickness: 13 mm in thickness.  No focal abnormality visualized.  Right ovary  Measurements: 2.4 x 2.5 x 1.8 cm. Normal appearance/no adnexal mass.  Left ovary  Measurements: 2.5 x 1.4 x 3.1 cm. Normal appearance/no adnexal mass.  Other findings  Small amount of free fluid in the pelvis.  IMPRESSION: No acute findings. Small amount of free fluid in the pelvis likely physiologic.   Electronically Signed   By: Rolm Baptise M.D.   On: 02/25/2015 08:48   Ct Abdomen Pelvis W Contrast  02/25/2015   CLINICAL DATA:  Severe pelvic pain, suprapubic pain beginning last night.  EXAM: CT ABDOMEN AND PELVIS WITH CONTRAST  TECHNIQUE: Multidetector CT imaging of the abdomen and pelvis was performed using the standard protocol following bolus administration of intravenous contrast.  CONTRAST:  153mL OMNIPAQUE IOHEXOL 300 MG/ML  SOLN  COMPARISON:  Pelvic ultrasound performed today  FINDINGS: Lung bases are clear.  No effusions.  Heart is normal size.  Liver, gallbladder, spleen, pancreas, adrenals and kidneys are normal. No renal or ureteral stones. No hydronephrosis.  Normal appendix. Stomach and small bowel are unremarkable. There is sigmoid diverticulosis. Inflammatory stranding noted around the sigmoid colon compatible with active diverticulitis. Uterus, adnexae and urinary bladder unremarkable. Trace free fluid in the pelvis.  Aorta is normal caliber.  No acute bony abnormality.  IMPRESSION: Sigmoid diverticulosis with surrounding  inflammatory stranding compatible with active diverticulitis.  Normal appendix.  Trace free fluid in the pelvis.   Electronically Signed   By: Rolm Baptise M.D.   On: 02/25/2015 11:21   Dg Foot Complete Left  02/14/2015   CLINICAL DATA:  Dorsal and lateral left foot pain today. No known injury. Initial encounter.  EXAM: LEFT FOOT - COMPLETE  3+ VIEW  COMPARISON:  None.  FINDINGS: There is no evidence of fracture or dislocation. There is no evidence of arthropathy or other focal bone abnormality. Soft tissues are unremarkable.  IMPRESSION: Normal exam.   Electronically Signed   By: Inge Rise M.D.   On: 02/14/2015 16:47    ____________________________________________   PROCEDURES  Procedure(s) performed: None  Critical Care performed: No ____________________________________________   INITIAL IMPRESSION / ASSESSMENT AND PLAN / ED COURSE  Pertinent labs & imaging results that were available during my care of the patient were reviewed by me and considered in my medical decision making (see chart for details).  Concerned for pelvic pain/cervical motion tenderness, we will await GC/Chlamydia results, wet prep, and we will obtain pelvic ultrasound.  ----------------------------------------- 9:41 AM on 02/25/2015 -----------------------------------------  Ultrasound was unremarkable and GC/chlamydia is negative.  So far I have no explanation for the patient's pain and leukocytosis.  I will further evaluate with a CT abdomen and pelvis.   ----------------------------------------- 11:40 AM on 02/25/2015 -----------------------------------------  The patient remains generally comfortable as long as she is relatively still.  Her vitals remained stable; her blood pressure is on the low side but it is consistent and her heart rate has remained in the 70s to 80s.  He is afebrile.Marland Kitchen  Her abdominal exam remains tender but is not firm and there is no evidence of acute peritonitis.  I discussed  with her and her husband the findings of acute diverticulitis.  I gave him my usual and customary verbal and written return precautions.  I believe that she is stable for outpatient treatment and she understands that she should come back if she develops worsening symptoms.  She understands and agrees with the plan.  I gave her a first dose of Cipro and Flagyl in the emergency department and give her prescriptions as listed below.  ____________________________________________  FINAL CLINICAL IMPRESSION(S) / ED DIAGNOSES  Final diagnoses:  Acute diverticulitis      NEW MEDICATIONS STARTED DURING THIS VISIT:  New Prescriptions   CIPROFLOXACIN (CIPRO) 500 MG TABLET    Take 1 tablet (500 mg total) by mouth 2 (two) times daily.   METRONIDAZOLE (FLAGYL) 500 MG TABLET    Take 1 tablet (500 mg total) by mouth 3 (three) times daily.   ONDANSETRON (ZOFRAN) 4 MG TABLET    Take 1-2 tabs by mouth every 8 hours as needed for nausea/vomiting   OXYCODONE-ACETAMINOPHEN (ROXICET) 5-325 MG PER TABLET    Take 1-2 tablets by mouth every 4 (four) hours as needed for severe pain.     Hinda Kehr, MD 02/25/15 1141

## 2015-02-25 NOTE — Discharge Instructions (Signed)
We believe your symptoms are caused by diverticulitis.  Most of the time this condition (please read through the included information) can be cured with outpatient antibiotics.  Please take the full course of prescribed medication(s) and follow up with the doctors recommended above.  Return to the ED if your abdominal pain worsens or fails to improve, you develop bloody vomiting, bloody diarrhea, you are unable to tolerate fluids due to vomiting, fever greater than 101, or other symptoms that concern you.  Take Percocet as prescribed for severe pain. Do not drink alcohol, drive or participate in any other potentially dangerous activities while taking this medication as it may make you sleepy. Do not take this medication with any other sedating medications, either prescription or over-the-counter. If you were prescribed Percocet or Vicodin, do not take these with acetaminophen (Tylenol) as it is already contained within these medications.   This medication is an opiate (or narcotic) pain medication and can be habit forming.  Use it as little as possible to achieve adequate pain control.  Do not use or use it with extreme caution if you have a history of opiate abuse or dependence.  If you are on a pain contract with your primary care doctor or a pain specialist, be sure to let them know you were prescribed this medication today from the [facility] Emergency Department.  This medication is intended for your use only - do not give any to anyone else and keep it in a secure place where nobody else, especially children, have access to it.  It will also cause or worsen constipation, so you may want to consider taking an over-the-counter stool softener while you are taking this medication.   Diverticulitis Diverticulitis is inflammation or infection of small pouches in your colon that form when you have a condition called diverticulosis. The pouches in your colon are called diverticula. Your colon, or  large intestine, is where water is absorbed and stool is formed. Complications of diverticulitis can include:  Bleeding.  Severe infection.  Severe pain.  Perforation of your colon.  Obstruction of your colon. CAUSES  Diverticulitis is caused by bacteria. Diverticulitis happens when stool becomes trapped in diverticula. This allows bacteria to grow in the diverticula, which can lead to inflammation and infection. RISK FACTORS People with diverticulosis are at risk for diverticulitis. Eating a diet that does not include enough fiber from fruits and vegetables may make diverticulitis more likely to develop. SYMPTOMS  Symptoms of diverticulitis may include:  Abdominal pain and tenderness. The pain is normally located on the left side of the abdomen, but may occur in other areas.  Fever and chills.  Bloating.  Cramping.  Nausea.  Vomiting.  Constipation.  Diarrhea.  Blood in your stool. DIAGNOSIS  Your health care provider will ask you about your medical history and do a physical exam. You may need to have tests done because many medical conditions can cause the same symptoms as diverticulitis. Tests may include:  Blood tests.  Urine tests.  Imaging tests of the abdomen, including X-rays and CT scans. When your condition is under control, your health care provider may recommend that you have a colonoscopy. A colonoscopy can show how severe your diverticula are and whether something else is causing your symptoms. TREATMENT  Most cases of diverticulitis are mild and can be treated at home. Treatment may include:  Taking over-the-counter pain medicines.  Following a clear liquid diet.  Taking antibiotic medicines by mouth for 7-10 days. More severe  cases may be treated at a hospital. Treatment may include:  Not eating or drinking.  Taking prescription pain medicine.  Receiving antibiotic medicines through an IV tube.  Receiving fluids and nutrition through an IV  tube.  Surgery. HOME CARE INSTRUCTIONS   Follow your health care provider's instructions carefully.  Follow a full liquid diet or other diet as directed by your health care provider. After your symptoms improve, your health care provider may tell you to change your diet. He or she may recommend you eat a high-fiber diet. Fruits and vegetables are good sources of fiber. Fiber makes it easier to pass stool.  Take fiber supplements or probiotics as directed by your health care provider.  Only take medicines as directed by your health care provider.  Keep all your follow-up appointments. SEEK MEDICAL CARE IF:   Your pain does not improve.  You have a hard time eating food.  Your bowel movements do not return to normal. SEEK IMMEDIATE MEDICAL CARE IF:   Your pain becomes worse.  Your symptoms do not get better.  Your symptoms suddenly get worse.  You have a fever.  You have repeated vomiting.  You have bloody or black, tarry stools. MAKE SURE YOU:   Understand these instructions.  Will watch your condition.  Will get help right away if you are not doing well or get worse. Document Released: 05/20/2005 Document Revised: 08/15/2013 Document Reviewed: 07/05/2013 [facility] Patient Information 2015 [place], [place]. This information is not intended to replace advice given to you by your health care provider. Make sure you discuss any questions you have with your health care provider.

## 2015-02-25 NOTE — ED Notes (Signed)
Pt returned from US

## 2015-02-25 NOTE — ED Notes (Signed)
Pt returned from CT °

## 2016-03-09 ENCOUNTER — Emergency Department: Payer: No Typology Code available for payment source

## 2016-03-09 ENCOUNTER — Emergency Department
Admission: EM | Admit: 2016-03-09 | Discharge: 2016-03-09 | Disposition: A | Discharge status: Home / Self Care | Payer: No Typology Code available for payment source | Attending: Emergency Medicine | Admitting: Emergency Medicine

## 2016-03-09 DIAGNOSIS — F17200 Nicotine dependence, unspecified, uncomplicated: Secondary | ICD-10-CM | POA: Insufficient documentation

## 2016-03-09 DIAGNOSIS — Y939 Activity, unspecified: Secondary | ICD-10-CM | POA: Diagnosis not present

## 2016-03-09 DIAGNOSIS — M545 Low back pain, unspecified: Secondary | ICD-10-CM

## 2016-03-09 DIAGNOSIS — J449 Chronic obstructive pulmonary disease, unspecified: Secondary | ICD-10-CM | POA: Diagnosis not present

## 2016-03-09 DIAGNOSIS — Y999 Unspecified external cause status: Secondary | ICD-10-CM | POA: Insufficient documentation

## 2016-03-09 DIAGNOSIS — J45909 Unspecified asthma, uncomplicated: Secondary | ICD-10-CM | POA: Insufficient documentation

## 2016-03-09 DIAGNOSIS — Y92410 Unspecified street and highway as the place of occurrence of the external cause: Secondary | ICD-10-CM | POA: Diagnosis not present

## 2016-03-09 LAB — POCT PREGNANCY, URINE: Preg Test, Ur: NEGATIVE

## 2016-03-09 MED ORDER — MELOXICAM 15 MG PO TABS: 15.0000 mg | ORAL_TABLET | Freq: Every day | ORAL | Status: DC

## 2016-03-09 MED ORDER — METHOCARBAMOL 500 MG PO TABS: 500.0000 mg | ORAL_TABLET | Freq: Four times a day (QID) | ORAL | Status: DC
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
## 2016-06-01 ENCOUNTER — Emergency Department
Admission: EM | Admit: 2016-06-01 | Discharge: 2016-06-01 | Disposition: A | Discharge status: Home / Self Care | Payer: Medicaid Other | Attending: Emergency Medicine | Admitting: Emergency Medicine

## 2016-06-01 DIAGNOSIS — R0789 Other chest pain: Secondary | ICD-10-CM | POA: Insufficient documentation

## 2016-06-01 DIAGNOSIS — Y9389 Activity, other specified: Secondary | ICD-10-CM | POA: Diagnosis not present

## 2016-06-01 DIAGNOSIS — J449 Chronic obstructive pulmonary disease, unspecified: Secondary | ICD-10-CM | POA: Diagnosis not present

## 2016-06-01 DIAGNOSIS — Y999 Unspecified external cause status: Secondary | ICD-10-CM | POA: Insufficient documentation

## 2016-06-01 DIAGNOSIS — F17200 Nicotine dependence, unspecified, uncomplicated: Secondary | ICD-10-CM | POA: Diagnosis not present

## 2016-06-01 DIAGNOSIS — S46012A Strain of muscle(s) and tendon(s) of the rotator cuff of left shoulder, initial encounter: Secondary | ICD-10-CM | POA: Insufficient documentation

## 2016-06-01 DIAGNOSIS — Z791 Long term (current) use of non-steroidal anti-inflammatories (NSAID): Secondary | ICD-10-CM | POA: Diagnosis not present

## 2016-06-01 DIAGNOSIS — J45909 Unspecified asthma, uncomplicated: Secondary | ICD-10-CM | POA: Insufficient documentation

## 2016-06-01 DIAGNOSIS — S4992XA Unspecified injury of left shoulder and upper arm, initial encounter: Secondary | ICD-10-CM | POA: Diagnosis present

## 2016-06-01 DIAGNOSIS — Y929 Unspecified place or not applicable: Secondary | ICD-10-CM | POA: Diagnosis not present

## 2016-06-01 DIAGNOSIS — X500XXA Overexertion from strenuous movement or load, initial encounter: Secondary | ICD-10-CM | POA: Diagnosis not present

## 2016-06-01 LAB — CBC
HCT: 34.7 % — ABNORMAL LOW (ref 35.0–47.0)
Hemoglobin: 12 g/dL (ref 12.0–16.0)
MCH: 29.4 pg (ref 26.0–34.0)
MCHC: 34.6 g/dL (ref 32.0–36.0)
MCV: 85 fL (ref 80.0–100.0)
PLATELETS: 265 10*3/uL (ref 150–440)
RBC: 4.08 MIL/uL (ref 3.80–5.20)
RDW: 13.6 % (ref 11.5–14.5)
WBC: 10.3 10*3/uL (ref 3.6–11.0)

## 2016-06-01 LAB — BASIC METABOLIC PANEL
Anion gap: 7 (ref 5–15)
BUN: 13 mg/dL (ref 6–20)
CALCIUM: 8.8 mg/dL — AB (ref 8.9–10.3)
CHLORIDE: 102 mmol/L (ref 101–111)
CO2: 26 mmol/L (ref 22–32)
CREATININE: 0.78 mg/dL (ref 0.44–1.00)
Glucose, Bld: 108 mg/dL — ABNORMAL HIGH (ref 65–99)
Potassium: 3.7 mmol/L (ref 3.5–5.1)
SODIUM: 135 mmol/L (ref 135–145)

## 2016-06-01 LAB — TROPONIN I

## 2016-06-01 NOTE — ED Triage Notes (Addendum)
Pt reports left sided axilla and lateral left sided chest pain sharp and shooting that radiates down left arm. Pt has pain X 2 days, no known injury.

## 2016-06-01 NOTE — ED Provider Notes (Signed)
[facility] Emergency Department Provider Note  ____________________________________________  Time seen: Approximately 5:18 PM  I have reviewed the triage vital signs and the nursing notes.   HISTORY  Chief Complaint Arm Pain    HPI Monica Pham is a 45 y.o. female who complains of intermittent left upper chest wall pain for the past 2 days. Worse with movement of her arm. Feels better when she lays down flat. She thinks it might be strain due to holding a 27 pound infant for prolonged periods of time.Denies any exertional symptoms, not pleuritic. Wells criteria negative. No diaphoresis shortness of breath or vomiting. No dizziness. Feels like she has paresthesias in her left arm as well with the pain. Although the pain is intermittent she is unable to quantify how long it lasted but it sounds like it is fleeting and achy.     Past Medical History:  Diagnosis Date  . Asthma   . COPD (chronic obstructive pulmonary disease) (HCC)      There are no active problems to display for this patient.    Past Surgical History:  Procedure Laterality Date  . BACK SURGERY    . TUBAL LIGATION       Prior to Admission medications   Medication Sig Start Date End Date Taking? Authorizing Provider  albuterol (PROVENTIL HFA;VENTOLIN HFA) 108 (90 BASE) MCG/ACT inhaler Inhale 2 puffs into the lungs every 6 (six) hours as needed for shortness of breath.    Historical Provider, MD  HYDROcodone-acetaminophen (NORCO/VICODIN) 5-325 MG per tablet Take 1 tablet by mouth every 4 (four) hours as needed. 05/30/14   Kristen N Ward, DO  ibuprofen (ADVIL,MOTRIN) 800 MG tablet Take 1 tablet (800 mg total) by mouth every 8 (eight) hours as needed for mild pain. 05/30/14   Kristen N Ward, DO  meloxicam (MOBIC) 15 MG tablet Take 1 tablet (15 mg total) by mouth daily. 03/09/16   Charline Bills Cuthriell, PA-C  methocarbamol (ROBAXIN) 500 MG tablet Take 1 tablet (500 mg total) by mouth 4 (four)  times daily. 03/09/16   Charline Bills Cuthriell, PA-C  naproxen (NAPROSYN) 500 MG tablet Take 1 tablet (500 mg total) by mouth 2 (two) times daily with a meal. 02/14/15   Sable Feil, PA-C  ondansetron [facility]) 4 MG tablet Take 1-2 tabs by mouth every 8 hours as needed for nausea/vomiting 02/25/15   Hinda Kehr, MD  oxyCODONE-acetaminophen (ROXICET) 5-325 MG per tablet Take 1-2 tablets by mouth every 4 (four) hours as needed for severe pain. 02/25/15   Hinda Kehr, MD     Allergies Tylenol [acetaminophen]   No family history on file.  Social History Social History  Substance Use Topics  . Smoking status: Current Every Day Smoker    Packs/day: 0.50  . Smokeless tobacco: Never Used  . Alcohol use No    Review of Systems  Constitutional:   No fever or chills.  ENT:   No sore throat. No rhinorrhea. Cardiovascular:   Positive as above chest wall pain. Respiratory:   No dyspnea or cough. Gastrointestinal:   Negative for abdominal pain, vomiting and diarrhea.   Musculoskeletal:   Left shoulder pain as above, otherwise Negative for focal pain or swelling  10-point ROS otherwise negative.  ____________________________________________   PHYSICAL EXAM:  VITAL SIGNS: ED Triage Vitals  Enc Vitals Group     BP 06/01/16 1518 129/79     Pulse Rate 06/01/16 1518 84     Resp 06/01/16 1518 18  Temp 06/01/16 1518 98.2 F (36.8 C)     Temp Source 06/01/16 1518 Oral     SpO2 06/01/16 1518 99 %     Weight 06/01/16 1519 150 lb (68 kg)     Height 06/01/16 1519 5\' 3"  (1.6 m)     Head Circumference --      Peak Flow --      Pain Score 06/01/16 1519 4     Pain Loc --      Pain Edu? --      Excl. in [place]? --     Vital signs reviewed, nursing assessments reviewed.   Constitutional:   Alert and oriented. Well appearing and in no distress. Eyes:   No scleral icterus. No conjunctival pallor. PERRL. EOMI.  No nystagmus. ENT   Head:   Normocephalic and atraumatic.   Nose:   No  congestion/rhinnorhea. No septal hematoma   Mouth/Throat:   MMM, no pharyngeal erythema. No peritonsillar mass.    Neck:   No stridor. No SubQ emphysema. No meningismus. Hematological/Lymphatic/Immunilogical:   No cervical lymphadenopathy. Cardiovascular:   RRR. Symmetric bilateral radial and DP pulses.  No murmurs.  Respiratory:   Normal respiratory effort without tachypnea nor retractions. Breath sounds are clear and equal bilaterally. No wheezes/rales/rhonchi.Left upper chest wall tender to the touch which reproduces the symptoms. Also reproduced with abduction and external rotation of the shoulder and having her try to bring the shoulder back to neutral position against resistance. Gastrointestinal:   Soft and nontender. Non distended. There is no CVA tenderness.  No rebound, rigidity, or guarding. Genitourinary:   deferred Musculoskeletal:   Nontender with normal range of motion in all extremities. No joint effusions.  No lower extremity tenderness.  No edema. Neurologic:   Normal speech and language.  CN 2-10 normal. Motor grossly intact. No gross focal neurologic deficits are appreciated.  Skin:    Skin is warm, dry and intact. No rash noted.  No petechiae, purpura, or bullae.  ____________________________________________    LABS (pertinent positives/negatives) (all labs ordered are listed, but only abnormal results are displayed) Labs Reviewed  BASIC METABOLIC PANEL - Abnormal; Notable for the following:       Result Value   Glucose, Bld 108 (*)    Calcium 8.8 (*)    All other components within normal limits  CBC - Abnormal; Notable for the following:    HCT 34.7 (*)    All other components within normal limits  TROPONIN I   ____________________________________________   EKG  Interpreted by me  Date: 06/01/2016  Rate: 80  Rhythm: normal sinus rhythm  QRS Axis: normal  Intervals: normal  ST/T Wave abnormalities: normal  Conduction Disutrbances: none   Narrative Interpretation: unremarkable      ____________________________________________    RADIOLOGY    ____________________________________________   PROCEDURES Procedures  ____________________________________________   INITIAL IMPRESSION / ASSESSMENT AND PLAN / ED COURSE  Pertinent labs & imaging results that were available during my care of the patient were reviewed by me and considered in my medical decision making (see chart for details).  Patient well appearing no acute distress. Unremarkable vital signs, normal EKG and labs. Presents with left chest wall pain, likely rotator cuff strain due to prolonged carrying of child.Considering the patient's symptoms, medical history, and physical examination today, I have low suspicion for ACS, PE, TAD, pneumothorax, carditis, mediastinitis, pneumonia, CHF, or sepsis.  Counseled on shoulder range of motion exercises, heat therapy, NSAIDs, follow-up with primary care.  Clinical Course   ____________________________________________   FINAL CLINICAL IMPRESSION(S) / ED DIAGNOSES  Final diagnoses:  Rotator cuff strain, left, initial encounter  Chest wall pain       Portions of this note were generated with dragon dictation software. Dictation errors may occur despite best attempts at proofreading.    Carrie Mew, MD 06/01/16 [phone]

## 2017-09-05 ENCOUNTER — Emergency Department: Payer: Self-pay

## 2017-09-05 ENCOUNTER — Emergency Department
Admission: EM | Admit: 2017-09-05 | Discharge: 2017-09-05 | Disposition: A | Discharge status: Home / Self Care | Payer: Self-pay | Attending: Emergency Medicine | Admitting: Emergency Medicine

## 2017-09-05 ENCOUNTER — Encounter: Payer: Self-pay | Admitting: Emergency Medicine

## 2017-09-05 ENCOUNTER — Other Ambulatory Visit: Payer: Self-pay

## 2017-09-05 DIAGNOSIS — F17200 Nicotine dependence, unspecified, uncomplicated: Secondary | ICD-10-CM | POA: Insufficient documentation

## 2017-09-05 DIAGNOSIS — M23300 Other meniscus derangements, unspecified lateral meniscus, right knee: Secondary | ICD-10-CM | POA: Insufficient documentation

## 2017-09-05 DIAGNOSIS — J45909 Unspecified asthma, uncomplicated: Secondary | ICD-10-CM | POA: Insufficient documentation

## 2017-09-05 DIAGNOSIS — J449 Chronic obstructive pulmonary disease, unspecified: Secondary | ICD-10-CM | POA: Insufficient documentation

## 2017-09-05 DIAGNOSIS — Z79899 Other long term (current) drug therapy: Secondary | ICD-10-CM | POA: Insufficient documentation

## 2017-09-05 MED ORDER — MELOXICAM 15 MG PO TABS: 15.0000 mg | ORAL_TABLET | Freq: Every day | ORAL | 0 refills | Status: DC

## 2017-09-05 NOTE — ED Notes (Signed)

## 2017-09-05 NOTE — ED Triage Notes (Signed)
First Nurse Note:  C/O right knee cap pain pain x 2 days.  Also c/o lower leg numbness.  Denies injury.  Ambulates with easy and steady gait.  NAD

## 2017-09-05 NOTE — ED Provider Notes (Signed)
[facility] Emergency Department Provider Note  ____________________________________________  Time seen: Approximately 3:53 PM  I have reviewed the triage vital signs and the nursing notes.   HISTORY  Chief Complaint Knee Pain    HPI Monica Pham is a 47 y.o. female who presents the emergency department complaining of right knee pain.  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  Patient denies any direct trauma to her knee.  She states that she has pain radiating from the back to front, more on the lateral aspect of her knee.  Patient reports that she often has a catching/clicking/popping sensation to her knee.  She denies any swelling, ecchymosis.  Patient denies any hip or ankle pain.  She has not tried any medications for this complaint.  No other complaints at this time.  Past Medical History:  Diagnosis Date  . Asthma   . COPD (chronic obstructive pulmonary disease) (HCC)     There are no active problems to display for this patient.   Past Surgical History:  Procedure Laterality Date  . BACK SURGERY    . TUBAL LIGATION      Prior to Admission medications   Medication Sig Start Date End Date Taking? Authorizing Provider  albuterol (PROVENTIL HFA;VENTOLIN HFA) 108 (90 BASE) MCG/ACT inhaler Inhale 2 puffs into the lungs every 6 (six) hours as needed for shortness of breath.    [provider]  HYDROcodone-acetaminophen (NORCO/VICODIN) 5-325 MG per tablet Take 1 tablet by mouth every 4 (four) hours as needed. 05/30/14   Ward, Delice Bison, DO  ibuprofen (ADVIL,MOTRIN) 800 MG tablet Take 1 tablet (800 mg total) by mouth every 8 (eight) hours as needed for mild pain. 05/30/14   Ward, Delice Bison, DO  meloxicam (MOBIC) 15 MG tablet Take 1 tablet (15 mg total) by mouth daily. 09/05/17   Emersynn Deatley, Charline Bills, PA-C  methocarbamol (ROBAXIN) 500 MG tablet Take 1 tablet (500 mg total) by mouth 4 (four) times daily. 03/09/16   Khris Jansson,  Charline Bills, PA-C  naproxen (NAPROSYN) 500 MG tablet Take 1 tablet (500 mg total) by mouth 2 (two) times daily with a meal. 02/14/15   Sable Feil, PA-C  ondansetron [facility]) 4 MG tablet Take 1-2 tabs by mouth every 8 hours as needed for nausea/vomiting 02/25/15   Hinda Kehr, MD  oxyCODONE-acetaminophen (ROXICET) 5-325 MG per tablet Take 1-2 tablets by mouth every 4 (four) hours as needed for severe pain. 02/25/15   Hinda Kehr, MD    Allergies Codeine  History reviewed. No pertinent family history.  Social History Social History   Tobacco Use  . Smoking status: Current Every Day Smoker    Packs/day: 0.50  . Smokeless tobacco: Never Used  Substance Use Topics  . Alcohol use: No  . Drug use: No     Review of Systems  Constitutional: No fever/chills Eyes: No visual changes.  Cardiovascular: no chest pain. Respiratory: no cough. No SOB. Gastrointestinal: No abdominal pain.  No nausea, no vomiting.   Musculoskeletal: Positive for right knee pain Skin: Negative for rash, abrasions, lacerations, ecchymosis. Neurological: Negative for headaches, focal weakness or numbness. 10-point ROS otherwise negative.  ____________________________________________   PHYSICAL EXAM:  VITAL SIGNS: ED Triage Vitals  Enc Vitals Group     BP 09/05/17 1547 (!) 141/79     Pulse Rate 09/05/17 1547 92     Resp 09/05/17 1547 18     Temp 09/05/17 1546 98.4 F (36.9 C)  Temp Source 09/05/17 1546 Oral     SpO2 09/05/17 1547 100 %     Weight 09/05/17 1547 155 lb (70.3 kg)     Height 09/05/17 1547 5\' 3"  (1.6 m)     Head Circumference --      Peak Flow --      Pain Score 09/05/17 1547 5     Pain Loc --      Pain Edu? --      Excl. in [place]? --      Constitutional: Alert and oriented. Well appearing and in no acute distress. Eyes: Conjunctivae are normal. PERRL. EOMI. Head: Atraumatic. Neck: No stridor.    Cardiovascular: Normal rate, regular rhythm. Normal S1 and S2.  Good peripheral  circulation. Respiratory: Normal respiratory effort without tachypnea or retractions. Lungs CTAB. Good air entry to the bases with no decreased or absent breath sounds. Musculoskeletal: Full range of motion to all extremities. No gross deformities appreciated.  No gross deformity, edema, ecchymosis noted to the right knee upon inspection.  Full range of motion to the right knee.  With extension, patient had an audible pop, with reported catching sensation.  Patient is tender to palpation along the lateral joint line with no palpable abnormality.  Varus, valgus, Lachman's is negative.  Positive for McMurray's to the lateral aspect.  Dorsalis pedis pulse intact distally.  Sensation intact distally.   Neurologic:  Normal speech and language. No gross focal neurologic deficits are appreciated.  Skin:  Skin is warm, dry and intact. No rash noted. Psychiatric: Mood and affect are normal. Speech and behavior are normal. Patient exhibits appropriate insight and judgement.   ____________________________________________   LABS (all labs ordered are listed, but only abnormal results are displayed)  Labs Reviewed - No data to display ____________________________________________  EKG   ____________________________________________  RADIOLOGY Diamantina Providence Breyonna Nault, personally viewed and evaluated these images (plain radiographs) as part of my medical decision making, as well as reviewing the written report by the radiologist.  Dg Knee Complete 4 Views Right  Result Date: 09/05/2017 CLINICAL DATA:  Sudden onset right knee pain without injury EXAM: RIGHT KNEE - COMPLETE 4+ VIEW COMPARISON:  August 29, 2010 FINDINGS: No evidence of fracture, dislocation, or joint effusion. No evidence of arthropathy or other focal bone abnormality. Soft tissues are unremarkable. IMPRESSION: No acute abnormality. Electronically Signed   By: Abelardo Diesel M.D.   On: 09/05/2017 16:36     ____________________________________________    PROCEDURES  Procedure(s) performed:    .Splint Application Date/Time: 4/69/6295 4:58 PM Performed by: Darletta Moll, PA-C Authorized by: Darletta Moll, PA-C   Consent:    Consent obtained:  Verbal   Consent given by:  Patient   Risks discussed:  Pain Pre-procedure details:    Sensation:  Normal Procedure details:    Laterality:  Right   Location:  Knee   Knee:  R knee   Splint type:  Knee immobilizer   Supplies:  Prefabricated splint Post-procedure details:    Pain:  Improved   Sensation:  Normal   Patient tolerance of procedure:  Tolerated well, no immediate complications      Medications - No data to display   ____________________________________________   INITIAL IMPRESSION / ASSESSMENT AND PLAN / ED COURSE  Pertinent labs & imaging results that were available during my care of the patient were reviewed by me and considered in my medical decision making (see chart for details).  Review of the [place] CSRS was performed  in accordance of the [place] prior to dispensing any controlled drugs.  Clinical Course as of Sep 05 1656  Sun Sep 05, 2017  1612 Patient presented with nontraumatic worsening right knee pain.  Patient with catching/clicking sensation with extension of the right knee.  Tenderness along the lateral joint line.  At this time, differential includes fracture versus arthritis versus patellofemoral syndrome versus meniscal derangement.  [JC]    Clinical Course User Index [JC] Arsenia Goracke, Charline Bills, PA-C    Patient's diagnosis is consistent with lateral meniscal derangement.  Patient presented with increasing pain to the right knee with catching and clicking sensation.  On exam, exam is consistent with derangement of the meniscus.  X-ray reveals no acute osseous abnormality.  After discussing findings with patient, patient did remember an episode approximately a month ago during the snow  when she had slipped, twisted her knee.  Patient reports that initially had been very painful, improved and then she developed these ongoing symptoms.  This is likely point of injury.  Patient is given knee immobilizer and crutches in the emergency department.. Patient will be discharged home with prescriptions for meloxicam. Patient is to follow up with orthopedic surgery as needed or otherwise directed. Patient is given ED precautions to return to the ED for any worsening or new symptoms.     ____________________________________________  FINAL CLINICAL IMPRESSION(S) / ED DIAGNOSES  Final diagnoses:  Derangement of lateral meniscus of right knee      NEW MEDICATIONS STARTED DURING THIS VISIT:  ED Discharge Orders        Ordered    meloxicam (MOBIC) 15 MG tablet  Daily     09/05/17 1656          This chart was dictated using voice recognition software/Dragon. Despite best efforts to proofread, errors can occur which can change the meaning. Any change was purely unintentional.    Darletta Moll, PA-C 09/05/17 1658    Harvest Dark, MD 09/05/17 2211

## 2017-09-05 NOTE — ED Triage Notes (Signed)
Pt here for pain to right knee X 2 days. On feet at work all day.  Feels a popping when moving knee.  Worse pain is when extends leg.  Ambulatory.

## 2017-09-23 ENCOUNTER — Other Ambulatory Visit: Payer: Self-pay | Admitting: Orthopedic Surgery

## 2017-09-23 DIAGNOSIS — M25561 Pain in right knee: Secondary | ICD-10-CM

## 2017-09-29 ENCOUNTER — Ambulatory Visit
Admission: RE | Admit: 2017-09-29 | Discharge: 2017-09-29 | Disposition: A | Discharge status: Home / Self Care | Payer: Medicaid Other | Source: Ambulatory Visit | Attending: Orthopedic Surgery | Admitting: Orthopedic Surgery

## 2017-09-29 DIAGNOSIS — M25561 Pain in right knee: Secondary | ICD-10-CM

## 2017-09-29 DIAGNOSIS — M25461 Effusion, right knee: Secondary | ICD-10-CM | POA: Insufficient documentation

## 2017-09-30 ENCOUNTER — Ambulatory Visit: Payer: No Typology Code available for payment source

## 2017-12-18 ENCOUNTER — Emergency Department
Admission: EM | Admit: 2017-12-18 | Discharge: 2017-12-18 | Disposition: A | Discharge status: Home / Self Care | Payer: Medicaid Other | Attending: Emergency Medicine | Admitting: Emergency Medicine

## 2017-12-18 ENCOUNTER — Other Ambulatory Visit: Payer: Self-pay

## 2017-12-18 ENCOUNTER — Emergency Department: Payer: Medicaid Other

## 2017-12-18 DIAGNOSIS — R079 Chest pain, unspecified: Secondary | ICD-10-CM | POA: Insufficient documentation

## 2017-12-18 DIAGNOSIS — J449 Chronic obstructive pulmonary disease, unspecified: Secondary | ICD-10-CM | POA: Insufficient documentation

## 2017-12-18 DIAGNOSIS — M542 Cervicalgia: Secondary | ICD-10-CM | POA: Insufficient documentation

## 2017-12-18 DIAGNOSIS — F17200 Nicotine dependence, unspecified, uncomplicated: Secondary | ICD-10-CM | POA: Insufficient documentation

## 2017-12-18 DIAGNOSIS — Z79899 Other long term (current) drug therapy: Secondary | ICD-10-CM | POA: Insufficient documentation

## 2017-12-18 LAB — BASIC METABOLIC PANEL
Anion gap: 7 (ref 5–15)
BUN: 8 mg/dL (ref 6–20)
CO2: 28 mmol/L (ref 22–32)
CREATININE: 0.83 mg/dL (ref 0.44–1.00)
Calcium: 8.4 mg/dL — ABNORMAL LOW (ref 8.9–10.3)
Chloride: 103 mmol/L (ref 101–111)
GFR calc Af Amer: >60 mL/min (ref >60)
GFR calc non Af Amer: >60 mL/min (ref >60)
GLUCOSE: 92 mg/dL (ref 65–99)
POTASSIUM: 4.4 mmol/L (ref 3.5–5.1)
Sodium: 138 mmol/L (ref 135–145)

## 2017-12-18 LAB — CBC WITH DIFFERENTIAL/PLATELET
Basophils Absolute: 0.1 10*3/uL (ref 0–0.1)
Basophils Relative: 1 %
EOS ABS: 0.1 10*3/uL (ref 0–0.7)
Eosinophils Relative: 1 %
HCT: 38.4 % (ref 35.0–47.0)
Hemoglobin: 13.2 g/dL (ref 12.0–16.0)
LYMPHS ABS: 2.3 10*3/uL (ref 1.0–3.6)
LYMPHS PCT: 23 %
MCH: 30.3 pg (ref 26.0–34.0)
MCHC: 34.3 g/dL (ref 32.0–36.0)
MCV: 88.2 fL (ref 80.0–100.0)
MONO ABS: 0.6 10*3/uL (ref 0.2–0.9)
MONOS PCT: 6 %
Neutro Abs: 6.8 10*3/uL — ABNORMAL HIGH (ref 1.4–6.5)
Neutrophils Relative %: 69 %
PLATELETS: 300 10*3/uL (ref 150–440)
RBC: 4.35 MIL/uL (ref 3.80–5.20)
RDW: 13.6 % (ref 11.5–14.5)
WBC: 9.8 10*3/uL (ref 3.6–11.0)

## 2017-12-18 LAB — TROPONIN I: Troponin I: <0.03 ng/mL (ref <0.03)

## 2017-12-18 NOTE — ED Provider Notes (Signed)
[facility] Emergency Department Provider Note  ___________________________________________   First MD Initiated Contact with Patient 12/18/17 1015     (approximate)  I have reviewed the triage vital signs and the nursing notes.   HISTORY  Chief Complaint Neck Pain and Arm Pain   HPI Monica Pham is a 47 y.o. female with a history of COPD as well as high cholesterol who was presented to the emergency department with 24 hours of left-sided thoracic and neck pain that is now radiating through to her chest.  Says the pain is a 5 out of 10 and is not associated with worsening shortness of breath, nausea or vomiting.  Says the pain worsens when she moves her neck to the right.  She says that she has not done any heavy lifting or injured herself.  However, she says that the pain started yesterday when she woke up and felt like she had slept funny.  She says the pain is constant and pressure-like.  She said that she took an Advil this morning with only minimal relief.  Patient offered but says that she does not want any pain medication at this time.  Past Medical History:  Diagnosis Date  . Asthma   . COPD (chronic obstructive pulmonary disease) (HCC)     There are no active problems to display for this patient.   Past Surgical History:  Procedure Laterality Date  . BACK SURGERY    . TUBAL LIGATION      Prior to Admission medications   Medication Sig Start Date End Date Taking? Authorizing Provider  albuterol (PROVENTIL HFA;VENTOLIN HFA) 108 (90 BASE) MCG/ACT inhaler Inhale 2 puffs into the lungs every 6 (six) hours as needed for shortness of breath.    [provider]  HYDROcodone-acetaminophen (NORCO/VICODIN) 5-325 MG per tablet Take 1 tablet by mouth every 4 (four) hours as needed. 05/30/14   Ward, Delice Bison, DO  ibuprofen (ADVIL,MOTRIN) 800 MG tablet Take 1 tablet (800 mg total) by mouth every 8 (eight) hours as needed for mild pain. 05/30/14    Ward, Delice Bison, DO  meloxicam (MOBIC) 15 MG tablet Take 1 tablet (15 mg total) by mouth daily. 09/05/17   Cuthriell, Charline Bills, PA-C  methocarbamol (ROBAXIN) 500 MG tablet Take 1 tablet (500 mg total) by mouth 4 (four) times daily. 03/09/16   Cuthriell, Charline Bills, PA-C  naproxen (NAPROSYN) 500 MG tablet Take 1 tablet (500 mg total) by mouth 2 (two) times daily with a meal. 02/14/15   Sable Feil, PA-C  ondansetron [facility]) 4 MG tablet Take 1-2 tabs by mouth every 8 hours as needed for nausea/vomiting 02/25/15   Hinda Kehr, MD  oxyCODONE-acetaminophen (ROXICET) 5-325 MG per tablet Take 1-2 tablets by mouth every 4 (four) hours as needed for severe pain. 02/25/15   Hinda Kehr, MD    Allergies Codeine  No family history on file.  Social History Social History   Tobacco Use  . Smoking status: Current Every Day Smoker    Packs/day: 0.50  . Smokeless tobacco: Never Used  Substance Use Topics  . Alcohol use: No  . Drug use: No    Review of Systems  Constitutional: No fever/chills Eyes: No visual changes. ENT: No sore throat. Cardiovascular: As above Respiratory: Patient says that she has baseline shortness of breath secondary to her COPD.  Says that it is not worsened over the past 24 hours. Gastrointestinal: No abdominal pain.  No nausea, no vomiting.  No diarrhea.  No constipation. Genitourinary: Negative for dysuria. Musculoskeletal: As above Skin: Negative for rash. Neurological: Negative for headaches, focal weakness or numbness.   ____________________________________________   PHYSICAL EXAM:  VITAL SIGNS: ED Triage Vitals  Enc Vitals Group     BP 12/18/17 1028 (!) 135/109     Pulse Rate 12/18/17 1028 82     Resp 12/18/17 1028 16     Temp 12/18/17 1028 97.9 F (36.6 C)     Temp Source 12/18/17 1028 Oral     SpO2 12/18/17 1028 99 %     Weight 12/18/17 1029 165 lb (74.8 kg)     Height 12/18/17 1029 5\' 3"  (1.6 m)     Head Circumference --      Peak Flow --        Pain Score 12/18/17 1029 4     Pain Loc --      Pain Edu? --      Excl. in [place]? --     Constitutional: Alert and oriented. Well appearing and in no acute distress. Eyes: Conjunctivae are normal.  Head: Atraumatic. Nose: No congestion/rhinnorhea. Mouth/Throat: Mucous membranes are moist.  Neck: No stridor.  Tenderness to palpation along the distribution of the left trapezius muscle.   Cardiovascular: Normal rate, regular rhythm. Grossly normal heart sounds.  Good peripheral circulation with equal and bilateral radial pulses.  Reproducible tenderness to palpation of the left chest wall. Respiratory: Normal respiratory effort.  No retractions. Lungs CTAB. Gastrointestinal: Soft and nontender. No distention.  Musculoskeletal: No lower extremity tenderness nor edema.  No joint effusions. Neurologic:  Normal speech and language. No gross focal neurologic deficits are appreciated. Skin:  Skin is warm, dry and intact. No rash noted. Psychiatric: Mood and affect are normal. Speech and behavior are normal.  ____________________________________________   LABS (all labs ordered are listed, but only abnormal results are displayed)  Labs Reviewed  CBC WITH DIFFERENTIAL/PLATELET - Abnormal; Notable for the following components:      Result Value   Neutro Abs 6.8 (*)    All other components within normal limits  BASIC METABOLIC PANEL - Abnormal; Notable for the following components:   Calcium 8.4 (*)    All other components within normal limits  TROPONIN I   ____________________________________________  EKG  ED ECG REPORT I, Doran Stabler, the attending physician, personally viewed and interpreted this ECG.   Date: 12/18/2017  EKG Time: 1018  Rate: 76  Rhythm: normal sinus rhythm  Axis: Normal  Intervals:none  ST&T Change: No ST segment elevation or depression.  No abnormal T wave inversion.  ____________________________________________  RADIOLOGY  No acute process on  the chest x-ray. ____________________________________________   PROCEDURES  Procedure(s) performed:   Procedures  Critical Care performed:   ____________________________________________   INITIAL IMPRESSION / ASSESSMENT AND PLAN / ED COURSE  Pertinent labs & imaging results that were available during my care of the patient were reviewed by me and considered in my medical decision making (see chart for details).  Differential diagnosis includes, but is not limited to, ACS, aortic dissection, pulmonary embolism, cardiac tamponade, pneumothorax, pneumonia, pericarditis, myocarditis, GI-related causes including esophagitis/gastritis, and musculoskeletal chest wall pain.   As part of my medical decision making, I reviewed the following data within the electronic MEDICAL RECORD NUMBER Notes from prior ED visits  ----------------------------------------- 12:00 PM on 12/18/2017 -----------------------------------------  Patient at this time resting without any distress.  Very reassuring lab work, EKG as well as chest x-ray.  Likely musculoskeletal pain.  However, patient has risk factors including high cholesterol, smoking and family history.  I will refer her to cardiology.  She will continue with her Aleve as well as start using icy hot or another salve to the affected areas.  She is understanding of the diagnosis as well as treatment plans and willing to comply. ____________________________________________   FINAL CLINICAL IMPRESSION(S) / ED DIAGNOSES  Neck pain.  Chest pain.    NEW MEDICATIONS STARTED DURING THIS VISIT:  New Prescriptions   No medications on file     Note:  This document was prepared using Dragon voice recognition software and may include unintentional dictation errors.     Orbie Pyo, MD 12/18/17 1201

## 2017-12-18 NOTE — ED Triage Notes (Signed)
Pt states that she woke up yesterday am with left sided neck pain, pt states that she worked on it and rubbed it and tried to work it out, pt states now pain is in left side of neck radiating into the left arm and into the left armpit. Pt reports taking alleve

## 2017-12-18 NOTE — ED Notes (Signed)
Report received from [facility] - pt came in for neck pain that radiates down to left shoulder and her left chest. Pain if reproducible.

## 2017-12-27 ENCOUNTER — Encounter: Payer: Self-pay | Admitting: Emergency Medicine

## 2017-12-27 ENCOUNTER — Emergency Department
Admission: EM | Admit: 2017-12-27 | Discharge: 2017-12-27 | Disposition: A | Discharge status: Home / Self Care | Payer: Medicaid Other | Attending: Emergency Medicine | Admitting: Emergency Medicine

## 2017-12-27 ENCOUNTER — Other Ambulatory Visit: Payer: Self-pay

## 2017-12-27 DIAGNOSIS — J45909 Unspecified asthma, uncomplicated: Secondary | ICD-10-CM | POA: Insufficient documentation

## 2017-12-27 DIAGNOSIS — Z79899 Other long term (current) drug therapy: Secondary | ICD-10-CM | POA: Insufficient documentation

## 2017-12-27 DIAGNOSIS — F17210 Nicotine dependence, cigarettes, uncomplicated: Secondary | ICD-10-CM | POA: Insufficient documentation

## 2017-12-27 DIAGNOSIS — R233 Spontaneous ecchymoses: Secondary | ICD-10-CM

## 2017-12-27 DIAGNOSIS — D229 Melanocytic nevi, unspecified: Secondary | ICD-10-CM

## 2017-12-27 DIAGNOSIS — L918 Other hypertrophic disorders of the skin: Secondary | ICD-10-CM | POA: Insufficient documentation

## 2017-12-27 MED ORDER — CEPHALEXIN 500 MG PO CAPS: 500.0000 mg | ORAL_CAPSULE | Freq: Four times a day (QID) | ORAL | 0 refills | Status: AC

## 2017-12-27 MED ORDER — TETANUS-DIPHTH-ACELL PERTUSSIS 5-2.5-18.5 LF-MCG/0.5 IM SUSP
0.5000 mL | Freq: Once | INTRAMUSCULAR | Status: AC
  Administered 2017-12-27: 0.5 mL via INTRAMUSCULAR
  Filled 2017-12-27: qty 0.5

## 2017-12-27 NOTE — ED Provider Notes (Signed)
[facility] Emergency Department Provider Note  ____________________________________________  Time seen: Approximately 4:54 PM  I have reviewed the triage vital signs and the nursing notes.   HISTORY  Chief Complaint Laceration    HPI Monica Pham is a 47 y.o. female that presents to the emergency department for evaluation of tear to mole for 4 days.  Patient states that her mole got caught on something 4 days ago.  Mole has been present her whole life.  She is unsure if it is changing but noticed a different area that is sticking out of mole this morning.  She has noticed some bleeding and yellow drainage on her shirt when she wakes up in the morning.  Tetanus shot is out of date. No fever, chills.  Past Medical History:  Diagnosis Date  . Asthma   . COPD (chronic obstructive pulmonary disease) (HCC)     There are no active problems to display for this patient.   Past Surgical History:  Procedure Laterality Date  . BACK SURGERY    . TUBAL LIGATION      Prior to Admission medications   Medication Sig Start Date End Date Taking? Authorizing Provider  albuterol (PROVENTIL HFA;VENTOLIN HFA) 108 (90 BASE) MCG/ACT inhaler Inhale 2 puffs into the lungs every 6 (six) hours as needed for shortness of breath.   Yes [provider]  ibuprofen (ADVIL,MOTRIN) 800 MG tablet Take 1 tablet (800 mg total) by mouth every 8 (eight) hours as needed for mild pain. 05/30/14  Yes Ward, Delice Bison, DO  cephALEXin (KEFLEX) 500 MG capsule Take 1 capsule (500 mg total) by mouth 4 (four) times daily for 10 days. 12/27/17 01/06/18  Laban Emperor, PA-C  HYDROcodone-acetaminophen (NORCO/VICODIN) 5-325 MG per tablet Take 1 tablet by mouth every 4 (four) hours as needed. 05/30/14   Ward, Delice Bison, DO  meloxicam (MOBIC) 15 MG tablet Take 1 tablet (15 mg total) by mouth daily. 09/05/17   Cuthriell, Charline Bills, PA-C  methocarbamol (ROBAXIN) 500 MG tablet Take 1 tablet (500 mg  total) by mouth 4 (four) times daily. 03/09/16   Cuthriell, Charline Bills, PA-C  naproxen (NAPROSYN) 500 MG tablet Take 1 tablet (500 mg total) by mouth 2 (two) times daily with a meal. 02/14/15   Sable Feil, PA-C  ondansetron [facility]) 4 MG tablet Take 1-2 tabs by mouth every 8 hours as needed for nausea/vomiting 02/25/15   Hinda Kehr, MD  oxyCODONE-acetaminophen (ROXICET) 5-325 MG per tablet Take 1-2 tablets by mouth every 4 (four) hours as needed for severe pain. 02/25/15   Hinda Kehr, MD    Allergies Codeine  No family history on file.  Social History Social History   Tobacco Use  . Smoking status: Current Every Day Smoker    Packs/day: 0.50  . Smokeless tobacco: Never Used  Substance Use Topics  . Alcohol use: No  . Drug use: No     Review of Systems  Constitutional: No fever/chills Cardiovascular: No chest pain. Respiratory: No SOB. Gastrointestinal: No abdominal pain.  No nausea, no vomiting.  No diarrhea.  No constipation. Musculoskeletal: Negative for musculoskeletal pain. Skin: Negative for ecchymosis. Neurological: Negative for headaches.   ____________________________________________   PHYSICAL EXAM:  VITAL SIGNS: ED Triage Vitals  Enc Vitals Group     BP 12/27/17 1605 114/67     Pulse Rate 12/27/17 1605 88     Resp 12/27/17 1605 18     Temp 12/27/17 1605 98.5 F (36.9 C)  Temp Source 12/27/17 1605 Oral     SpO2 12/27/17 1605 99 %     Weight 12/27/17 1607 165 lb (74.8 kg)     Height 12/27/17 1607 5\' 3"  (1.6 m)     Head Circumference --      Peak Flow --      Pain Score 12/27/17 1607 0     Pain Loc --      Pain Edu? --      Excl. in [place]? --      Constitutional: Alert and oriented. Well appearing and in no acute distress. Eyes: Conjunctivae are normal. PERRL. EOMI. No discharge. Head: Atraumatic. ENT: No frontal and maxillary sinus tenderness.      Ears:       Nose: No congestion/rhinnorhea.      Mouth/Throat: Mucous membranes are moist.   Neck: No stridor.   Hematological/Lymphatic/Immunilogical: No cervical lymphadenopathy. Cardiovascular: Normal rate, regular rhythm.  Good peripheral circulation. Respiratory: Normal respiratory effort without tachypnea or retractions. Lungs CTAB. Good air entry to the bases with no decreased or absent breath sounds. Gastrointestinal: Bowel sounds 4 quadrants. Soft and nontender to palpation. No guarding or rigidity. No palpable masses. No distention. Musculoskeletal: Full range of motion to all extremities. No gross deformities appreciated. Neurologic:  Normal speech and language. No gross focal neurologic deficits are appreciated.  Skin:  Skin is warm, dry. 1/2cm x 1/2cm brown irregular shaped mole to left breast. Laceration to top of mole. No active bleeding. Yellow drainage. No surrounding erythema.    ____________________________________________   LABS (all labs ordered are listed, but only abnormal results are displayed)  Labs Reviewed - No data to display ____________________________________________  EKG   ____________________________________________  RADIOLOGY   No results found.  ____________________________________________    PROCEDURES  Procedure(s) performed:    Procedures    Medications  Tdap (BOOSTRIX) injection 0.5 mL (0.5 mLs Intramuscular Given 12/27/17 1720)     ____________________________________________   INITIAL IMPRESSION / ASSESSMENT AND PLAN / ED COURSE  Pertinent labs & imaging results that were available during my care of the patient were reviewed by me and considered in my medical decision making (see chart for details).  Review of the [place] CSRS was performed in accordance of the [place] prior to dispensing any controlled drugs.   Patient presented to the emergency department for evaluation of tear to base of mole for 4 days. Vital signs and exam are reassuring. Mole has yellow drainage from center with concern for infection. We  discussed not removing mole until infection is clear. We also discussed that mole should be sent for biopsy and would be best removed by dermatology. Patient will be discharged home with prescriptions for keflex. Patient is to follow up with dermatology as needed or otherwise directed. She will keep dressing on mole until sees dermatology. Patient is given ED precautions to return to the ED for any worsening or new symptoms.     ____________________________________________  FINAL CLINICAL IMPRESSION(S) / ED DIAGNOSES  Final diagnoses:  Bleeding skin mole      NEW MEDICATIONS STARTED DURING THIS VISIT:  ED Discharge Orders        Ordered    cephALEXin (KEFLEX) 500 MG capsule  4 times daily     12/27/17 1709          This chart was dictated using voice recognition software/Dragon. Despite best efforts to proofread, errors can occur which can change the meaning. Any change was purely unintentional.    Earleen Newport,  Caryl Pina, PA-C 12/27/17 [place], [place], MD 12/27/17 2019

## 2017-12-27 NOTE — ED Notes (Signed)
Has a mole on left breast for years.  Says it got pulled on Thursday and now wont heal.

## 2017-12-27 NOTE — ED Triage Notes (Signed)
Has large mole L breast got caught on shirt 4 days ago and has had some bleeding since from tear. Mole still there.

## 2018-01-20 ENCOUNTER — Other Ambulatory Visit: Payer: Self-pay

## 2018-01-20 ENCOUNTER — Encounter: Payer: Self-pay | Admitting: Emergency Medicine

## 2018-01-20 ENCOUNTER — Emergency Department
Admission: EM | Admit: 2018-01-20 | Discharge: 2018-01-20 | Disposition: A | Discharge status: Home / Self Care | Payer: Medicaid Other | Attending: Emergency Medicine | Admitting: Emergency Medicine

## 2018-01-20 DIAGNOSIS — W57XXXA Bitten or stung by nonvenomous insect and other nonvenomous arthropods, initial encounter: Secondary | ICD-10-CM | POA: Insufficient documentation

## 2018-01-20 DIAGNOSIS — Y939 Activity, unspecified: Secondary | ICD-10-CM | POA: Insufficient documentation

## 2018-01-20 DIAGNOSIS — N751 Abscess of Bartholin's gland: Secondary | ICD-10-CM

## 2018-01-20 DIAGNOSIS — Z79899 Other long term (current) drug therapy: Secondary | ICD-10-CM | POA: Insufficient documentation

## 2018-01-20 DIAGNOSIS — J45909 Unspecified asthma, uncomplicated: Secondary | ICD-10-CM | POA: Insufficient documentation

## 2018-01-20 DIAGNOSIS — F17200 Nicotine dependence, unspecified, uncomplicated: Secondary | ICD-10-CM | POA: Insufficient documentation

## 2018-01-20 DIAGNOSIS — Y999 Unspecified external cause status: Secondary | ICD-10-CM | POA: Insufficient documentation

## 2018-01-20 DIAGNOSIS — S30861A Insect bite (nonvenomous) of abdominal wall, initial encounter: Secondary | ICD-10-CM | POA: Insufficient documentation

## 2018-01-20 DIAGNOSIS — Y929 Unspecified place or not applicable: Secondary | ICD-10-CM | POA: Insufficient documentation

## 2018-01-20 LAB — URINALYSIS, COMPLETE (UACMP) WITH MICROSCOPIC
Bacteria, UA: NONE SEEN
Bilirubin Urine: NEGATIVE
Glucose, UA: NEGATIVE mg/dL
Hgb urine dipstick: NEGATIVE
Ketones, ur: NEGATIVE mg/dL
LEUKOCYTES UA: NEGATIVE
Nitrite: NEGATIVE
PH: 6 (ref 5.0–8.0)
Protein, ur: NEGATIVE mg/dL
SPECIFIC GRAVITY, URINE: 1.005 (ref 1.005–1.030)

## 2018-01-20 LAB — POCT PREGNANCY, URINE: Preg Test, Ur: NEGATIVE

## 2018-01-20 MED ORDER — DOXYCYCLINE HYCLATE 100 MG PO TABS
100.0000 mg | ORAL_TABLET | Freq: Once | ORAL | Status: AC
  Administered 2018-01-20: 100 mg via ORAL
  Filled 2018-01-20: qty 1

## 2018-01-20 MED ORDER — DOXYCYCLINE HYCLATE 100 MG PO TABS: 100.0000 mg | ORAL_TABLET | Freq: Two times a day (BID) | ORAL | 0 refills | Status: DC

## 2018-01-20 MED ORDER — LIDOCAINE HCL (PF) 1 % IJ SOLN
5.0000 mL | Freq: Once | INTRAMUSCULAR | Status: AC
  Administered 2018-01-20: 5 mL
  Filled 2018-01-20: qty 5

## 2018-01-20 NOTE — ED Provider Notes (Signed)
[facility] Emergency Department Provider Note ____________________________________________  Time seen: 1134  I have reviewed the triage vital signs and the nursing notes.  HISTORY  Chief Complaint  Tick Removal and Groin Swelling  HPI Monica Pham is a 47 y.o. female presents herself to the ED for evaluation of a tick attached to her left groin.  She also has an unrelated complaint of some swelling to her right labia.  Denies any interim fevers, chills, sweats, myalgias, arthralgias, or rashes.  She is not sure how long the tick has been attached to her left groin.  Past Medical History:  Diagnosis Date  . Asthma   . COPD (chronic obstructive pulmonary disease) (HCC)     There are no active problems to display for this patient.   Past Surgical History:  Procedure Laterality Date  . BACK SURGERY    . TUBAL LIGATION      Prior to Admission medications   Medication Sig Start Date End Date Taking? Authorizing Provider  albuterol (PROVENTIL HFA;VENTOLIN HFA) 108 (90 BASE) MCG/ACT inhaler Inhale 2 puffs into the lungs every 6 (six) hours as needed for shortness of breath.    [provider]  doxycycline (VIBRA-TABS) 100 MG tablet Take 1 tablet (100 mg total) by mouth 2 (two) times daily. 01/20/18   Analayah Brooke, Dannielle Karvonen, PA-C  HYDROcodone-acetaminophen (NORCO/VICODIN) 5-325 MG per tablet Take 1 tablet by mouth every 4 (four) hours as needed. 05/30/14   Ward, Delice Bison, DO  ibuprofen (ADVIL,MOTRIN) 800 MG tablet Take 1 tablet (800 mg total) by mouth every 8 (eight) hours as needed for mild pain. 05/30/14   Ward, Delice Bison, DO  meloxicam (MOBIC) 15 MG tablet Take 1 tablet (15 mg total) by mouth daily. 09/05/17   Cuthriell, Charline Bills, PA-C  methocarbamol (ROBAXIN) 500 MG tablet Take 1 tablet (500 mg total) by mouth 4 (four) times daily. 03/09/16   Cuthriell, Charline Bills, PA-C  naproxen (NAPROSYN) 500 MG tablet Take 1 tablet (500 mg total) by mouth 2 (two)  times daily with a meal. 02/14/15   Sable Feil, PA-C  ondansetron [facility]) 4 MG tablet Take 1-2 tabs by mouth every 8 hours as needed for nausea/vomiting 02/25/15   Hinda Kehr, MD  oxyCODONE-acetaminophen (ROXICET) 5-325 MG per tablet Take 1-2 tablets by mouth every 4 (four) hours as needed for severe pain. 02/25/15   Hinda Kehr, MD    Allergies Codeine  No family history on file.  Social History Social History   Tobacco Use  . Smoking status: Current Every Day Smoker    Packs/day: 0.50  . Smokeless tobacco: Never Used  Substance Use Topics  . Alcohol use: No  . Drug use: No    Review of Systems  Constitutional: Negative for fever. Cardiovascular: Negative for chest pain. Respiratory: Negative for shortness of breath. Gastrointestinal: Negative for abdominal pain, vomiting and diarrhea. Genitourinary: Negative for dysuria. Vulvar swelling as above Musculoskeletal: Negative for back pain. Skin: Negative for rash. Tick attached.  Neurological: Negative for headaches, focal weakness or numbness. ____________________________________________  PHYSICAL EXAM:  VITAL SIGNS: ED Triage Vitals [01/20/18 1051]  Enc Vitals Group     BP 127/83     Pulse Rate 85     Resp 18     Temp 98.5 F (36.9 C)     Temp Source Oral     SpO2 99 %     Weight 165 lb (74.8 kg)     Height 5\' 3"  (1.6  m)     Head Circumference      Peak Flow      Pain Score 0     Pain Loc      Pain Edu?      Excl. in [place]?     Constitutional: Alert and oriented. Well appearing and in no distress. Head: Normocephalic and atraumatic. Cardiovascular: Normal rate, regular rhythm. Normal distal pulses. Respiratory: Normal respiratory effort. No wheezes/rales/rhonchi. GU: normal external genitalia except for a focal abscess formation to the right labia minora, consistent with a small bartholin's gland abscess.  Musculoskeletal: Nontender with normal range of motion in all extremities.  Neurologic:  Normal  gait without ataxia. Normal speech and language. No gross focal neurologic deficits are appreciated. Skin:  Skin is warm, dry and intact. No rash noted. Patient with a small, engorged tick noted to the left inguinal crease. The tick backs out completely after application of an alcohol gauze pad. Focal papule noted.  ____________________________________________  PROCEDURES  Doxycycline 100 mg PO  .Marland KitchenIncision and Drainage Date/Time: 01/20/2018 6:29 PM Performed by: Melvenia Needles, PA-C Authorized by: Melvenia Needles, PA-C   Consent:    Consent obtained:  Verbal   Consent given by:  Patient   Risks discussed:  Bleeding, infection, incomplete drainage and pain   Alternatives discussed:  Alternative treatment, delayed treatment and observation Location:    Type:  Bartholin cyst   Location:  Anogenital   Anogenital location:  Vulva Pre-procedure details:    Skin preparation:  Betadine Anesthesia (see MAR for exact dosages):    Anesthesia method:  Local infiltration   Local anesthetic:  Lidocaine 1% w/o epi Procedure type:    Complexity:  Complex Procedure details:    Incision types:  Stab incision   Scalpel blade:  11   Wound management:  Probed and deloculated   Drainage:  Purulent   Drainage amount:  Moderate   Packing materials:  1/4 in iodoform gauze Post-procedure details:    Patient tolerance of procedure:  Tolerated well, no immediate complications  ____________________________________________  INITIAL IMPRESSION / ASSESSMENT AND PLAN / ED COURSE  She with ED evaluation and management of an attached tick to left groin which is removed easily without incident.  She also has a right Bartholin's gland abscess.  She has resolution of her pain and swelling after successful I&D procedure.  Patient has a small iodoform gauze wick placed to promote healing.  She may return to the ED into 3 days for wound check as needed or remove the packing herself.  She is  discharged with a prescription for doxycycline which will cover the skin infection as well as any potential tickborne illness.  Return precautions have been reviewed. ____________________________________________  FINAL CLINICAL IMPRESSION(S) / ED DIAGNOSES  Final diagnoses:  Tick bite, initial encounter  Bartholin's gland abscess      Melvenia Needles, PA-C 01/20/18 1831    Harvest Dark, MD 01/21/18 1136

## 2018-01-20 NOTE — Discharge Instructions (Signed)
You have been treated for a bartholin's gland abscess, with an incision & drainage procedure. Keep the area clean and covered. Take the antibiotic as directed. The same antibiotic will help prevent any tickborne illness from the tick bite you sustained. Continue to monitor for any worsening symptoms. Return to the ED in 3 days for wound check and packing removal, as needed.

## 2018-01-20 NOTE — ED Triage Notes (Signed)
Presents with possible to to left lower abd  Noticed this area this am  Also having some swelling to right side of vaginal area

## 2018-05-13 ENCOUNTER — Other Ambulatory Visit: Payer: Self-pay | Admitting: Family Medicine

## 2018-05-13 DIAGNOSIS — Z1231 Encounter for screening mammogram for malignant neoplasm of breast: Secondary | ICD-10-CM

## 2018-06-13 ENCOUNTER — Encounter: Payer: Self-pay | Admitting: Emergency Medicine

## 2018-06-13 ENCOUNTER — Other Ambulatory Visit: Payer: Self-pay

## 2018-06-13 ENCOUNTER — Emergency Department
Admission: EM | Admit: 2018-06-13 | Discharge: 2018-06-13 | Disposition: A | Discharge status: Home / Self Care | Payer: BLUE CROSS/BLUE SHIELD | Attending: Student in an Organized Health Care Education/Training Program | Admitting: Student in an Organized Health Care Education/Training Program

## 2018-06-13 DIAGNOSIS — F17210 Nicotine dependence, cigarettes, uncomplicated: Secondary | ICD-10-CM | POA: Insufficient documentation

## 2018-06-13 DIAGNOSIS — Z79899 Other long term (current) drug therapy: Secondary | ICD-10-CM | POA: Diagnosis not present

## 2018-06-13 DIAGNOSIS — R200 Anesthesia of skin: Secondary | ICD-10-CM | POA: Diagnosis present

## 2018-06-13 DIAGNOSIS — J449 Chronic obstructive pulmonary disease, unspecified: Secondary | ICD-10-CM | POA: Insufficient documentation

## 2018-06-13 DIAGNOSIS — R202 Paresthesia of skin: Secondary | ICD-10-CM | POA: Diagnosis not present

## 2018-06-13 DIAGNOSIS — G5602 Carpal tunnel syndrome, left upper limb: Secondary | ICD-10-CM | POA: Diagnosis not present

## 2018-06-13 NOTE — ED Provider Notes (Signed)
[name] Note   CSN: [id number] Arrival date & time: 06/13/18  1533     History   Chief Complaint Chief Complaint  Patient presents with  . Numbness    HPI Monica Pham is a 47 y.o. female.  Presents to the emergency department evaluation of numbness and tingling in the left hand.  Patient's had numbness and tingling in the thumb index middle and ring finger left hand x2 weeks.  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  She also has numbness while she is driving her car occasionally at work.  She occasionally take Tylenol or ibuprofen.  She has not worn any bracing.  She has not been diagnosed with carpal tunnel syndrome.  She denies any neck pain elbow pain or radicular symptoms throughout the upper extremity.  No weakness, chest pain, shortness of breath.  HPI  Past Medical History:  Diagnosis Date  . Asthma   . COPD (chronic obstructive pulmonary disease) (HCC)     There are no active problems to display for this patient.   Past Surgical History:  Procedure Laterality Date  . BACK SURGERY    . TUBAL LIGATION       OB History   None      Home Medications    Prior to Admission medications   Medication Sig Start Date End Date Taking? Authorizing Provider  albuterol (PROVENTIL HFA;VENTOLIN HFA) 108 (90 BASE) MCG/ACT inhaler Inhale 2 puffs into the lungs every 6 (six) hours as needed for shortness of breath.    [provider]  doxycycline (VIBRA-TABS) 100 MG tablet Take 1 tablet (100 mg total) by mouth 2 (two) times daily. 01/20/18   Menshew, Dannielle Karvonen, PA-C  HYDROcodone-acetaminophen (NORCO/VICODIN) 5-325 MG per tablet Take 1 tablet by mouth every 4 (four) hours as needed. 05/30/14   Ward, Delice Bison, DO  ibuprofen (ADVIL,MOTRIN) 800 MG tablet Take 1 tablet (800 mg total) by mouth every 8 (eight) hours as  needed for mild pain. 05/30/14   Ward, Delice Bison, DO  meloxicam (MOBIC) 15 MG tablet Take 1 tablet (15 mg total) by mouth daily. 09/05/17   Cuthriell, Charline Bills, PA-C  methocarbamol (ROBAXIN) 500 MG tablet Take 1 tablet (500 mg total) by mouth 4 (four) times daily. 03/09/16   Cuthriell, Charline Bills, PA-C  naproxen (NAPROSYN) 500 MG tablet Take 1 tablet (500 mg total) by mouth 2 (two) times daily with a meal. 02/14/15   Sable Feil, PA-C  ondansetron [facility]) 4 MG tablet Take 1-2 tabs by mouth every 8 hours as needed for nausea/vomiting 02/25/15   Hinda Kehr, MD  oxyCODONE-acetaminophen (ROXICET) 5-325 MG per tablet Take 1-2 tablets by mouth every 4 (four) hours as needed for severe pain. 02/25/15   Hinda Kehr, MD    Family History No family history on file.  Social History Social History   Tobacco Use  . Smoking status: Current Every Day Smoker    Packs/day: 0.50  . Smokeless tobacco: Never Used  Substance Use Topics  . Alcohol use: No  . Drug use: No     Allergies   Codeine   Review of Systems Review of Systems  Constitutional: Negative for fever.  Respiratory: Negative for shortness of breath.   Cardiovascular: Negative for chest pain.  Gastrointestinal: Negative for abdominal pain.  Genitourinary: Negative for difficulty urinating, dysuria and  urgency.  Musculoskeletal: Negative for back pain and myalgias.  Skin: Negative for rash.  Neurological: Positive for numbness. Negative for dizziness, weakness and headaches.     Physical Exam Updated Vital Signs BP 126/80 (BP Location: Right Arm)   Pulse 73   Temp 98.7 F (37.1 C) (Oral)   Ht 5\' 3"  (1.6 m)   Wt 74.8 kg   LMP 06/10/2018   SpO2 99%   BMI 29.23 kg/m   Physical Exam  Constitutional: She is oriented to person, place, and time. She appears well-developed and well-nourished.  HENT:  Head: Normocephalic and atraumatic.  Eyes: Conjunctivae are normal.  Neck: Normal range of motion.  Cardiovascular:  Normal rate.  Pulmonary/Chest: Effort normal. No respiratory distress.  Musculoskeletal: Normal range of motion.  Cervical Spine: Examination of the cervical spine reveals no bony abnormality, no edema, and no ecchymosis.  There is no step-off.  The patient has full active and passive range of motion of the cervical spine with flexion, extension, and right and left bend with rotation.  There is no crepitus with range of motion exercises.  The patient is non-tender along the spinous process to palpation.  The patient has no paravertebral muscle spasm.  There is no parascapular discomfort.  The patient has a negative axial compression test.  The patient has a negative Spurling test.  The patient has a negative overhead arm test for thoracic outlet syndrome.    Left upper Extremity: Examination of the left shoulder and arm showed no bony abnormality or edema.  The patient has normal active and passive motion with abduction, flexion, internal rotation, and external rotation.  The patient has no tenderness with motion.  The patient has a negative Hawkins test and a negative impingement test.  The patient has a negative drop arm test.  The patient is non-tender along the deltoid muscle.  There is no subacromial space tenderness with no AC joint tenderness.  The patient has no instability of the shoulder with anterior-posterior motion.  There is a negative sulcus sign.  The rotator cuff muscle strength is 5/5 with supraspinatus, 5/5 with internal rotation, and 5/5 with external rotation.  There is no crepitus with range of motion activities.  Positive Tinel's and Phalen signs of the left wrist.  Numbness and tingling reproduced in the median nerve distribution with Phalen sign.   Neurological: She is alert and oriented to person, place, and time.  Skin: Skin is warm. No rash noted.  Psychiatric: She has a normal mood and affect. Her behavior is normal. Thought content normal.     ED Treatments / Results    Labs (all labs ordered are listed, but only abnormal results are displayed) Labs Reviewed - No data to display  EKG None  Radiology No results found.  Procedures Procedures (including critical care time)  Medications Ordered in ED Medications - No data to display   Initial Impression / Assessment and Plan / ED Course  I have reviewed the triage vital signs and the nursing notes.  Pertinent labs & imaging results that were available during my care of the patient were reviewed by me and considered in my medical decision making (see chart for details).     47 year old female with left carpal tunnel symptoms x2 weeks.  Physical exam and history consistent with carpal tunnel syndrome.  She is placed into a Velcro wrist brace today in the emergency department.  She is educated on signs symptoms return to the ED for.  She will  call orthopedics if no improvement 1 to 2 weeks.  Final Clinical Impressions(s) / ED Diagnoses   Final diagnoses:  Carpal tunnel syndrome, left    ED Discharge Orders    None       Renata Caprice 06/13/18 1728    Merlyn Lot, MD 06/13/18 2158

## 2018-06-13 NOTE — ED Notes (Signed)
Pt in bed, nad, grandchildren at bedside.

## 2018-06-13 NOTE — Discharge Instructions (Addendum)
Please wear a Velcro wrist brace at all times except for showering.  Most importantly wear at nighttime and while you are at work if possible.  Avoid repetitive wrist range of motion.  You may take Tylenol and/or ibuprofen as needed for pain.  If no improvement in 1 to 2 weeks with bracing recommended follow-up with orthopedics.

## 2018-06-13 NOTE — ED Triage Notes (Signed)
L wrist and hand numbness x 2 weeks. 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 No fall or injury.

## 2018-06-13 NOTE — ED Notes (Signed)
Wrist brace applied per order by Tammy, Medic.

## 2019-07-31 ENCOUNTER — Other Ambulatory Visit: Payer: Self-pay

## 2019-07-31 DIAGNOSIS — Z20822 Contact with and (suspected) exposure to covid-19: Secondary | ICD-10-CM

## 2019-08-01 LAB — NOVEL CORONAVIRUS, NAA: SARS-CoV-2, NAA: NOT DETECTED

## 2019-11-01 ENCOUNTER — Ambulatory Visit
Admission: RE | Admit: 2019-11-01 | Discharge: 2019-11-01 | Disposition: A | Discharge status: Home / Self Care | Payer: Self-pay | Source: Ambulatory Visit | Attending: Oncology | Admitting: Oncology

## 2019-11-01 ENCOUNTER — Ambulatory Visit: Payer: Self-pay | Attending: Oncology

## 2019-11-01 ENCOUNTER — Other Ambulatory Visit: Payer: Self-pay

## 2019-11-01 VITALS — BP 134/85 | HR 70 | Temp 96.9°F | Ht 63.5 in | Wt 157.7 lb

## 2019-11-01 DIAGNOSIS — Z0000 Encounter for general adult medical examination without abnormal findings: Secondary | ICD-10-CM

## 2019-11-01 NOTE — Progress Notes (Signed)
49 year old patient presents for [facility] clinic visit. Clinical breast exam unremarkable. Instructed patient on breast self awareness using teach back method.  No mass or lump palpated.  Sent for bilateral screening mammgram.  Risk Assessment    Risk Scores      11/01/2019   Last edited by: Rico Junker, RN   5-year risk: 0.6 %   Lifetime risk: 6.8 %

## 2019-11-08 ENCOUNTER — Other Ambulatory Visit: Payer: Self-pay | Admitting: *Deleted

## 2019-11-08 ENCOUNTER — Inpatient Hospital Stay
Admission: RE | Admit: 2019-11-08 | Discharge: 2019-11-08 | Disposition: A | Discharge status: Home / Self Care | Payer: Self-pay | Source: Ambulatory Visit | Attending: *Deleted | Admitting: *Deleted

## 2019-11-08 DIAGNOSIS — Z1231 Encounter for screening mammogram for malignant neoplasm of breast: Secondary | ICD-10-CM

## 2019-11-08 NOTE — Progress Notes (Signed)
Letter mailed from [facility] to notify of normal mammogram results.  Patient to return in one year for annual screening.  Copy to [facility]. 

## 2019-11-10 ENCOUNTER — Emergency Department
Admission: EM | Admit: 2019-11-10 | Discharge: 2019-11-10 | Disposition: A | Discharge status: Home / Self Care | Payer: Self-pay | Attending: Emergency Medicine | Admitting: Emergency Medicine

## 2019-11-10 ENCOUNTER — Other Ambulatory Visit: Payer: Self-pay

## 2019-11-10 ENCOUNTER — Encounter: Payer: Self-pay | Admitting: Emergency Medicine

## 2019-11-10 ENCOUNTER — Emergency Department: Payer: Self-pay

## 2019-11-10 DIAGNOSIS — F17210 Nicotine dependence, cigarettes, uncomplicated: Secondary | ICD-10-CM | POA: Insufficient documentation

## 2019-11-10 DIAGNOSIS — M25521 Pain in right elbow: Secondary | ICD-10-CM

## 2019-11-10 DIAGNOSIS — J449 Chronic obstructive pulmonary disease, unspecified: Secondary | ICD-10-CM | POA: Insufficient documentation

## 2019-11-10 MED ORDER — CYCLOBENZAPRINE HCL 10 MG PO TABS: 10.0000 mg | ORAL_TABLET | Freq: Three times a day (TID) | ORAL | 0 refills | Status: DC | PRN

## 2019-11-10 MED ORDER — IBUPROFEN 600 MG PO TABS: 600.0000 mg | ORAL_TABLET | Freq: Three times a day (TID) | ORAL | 0 refills | Status: DC | PRN

## 2019-11-10 NOTE — ED Triage Notes (Signed)
Pt presnets to ED via POV with c/o R elbow pain, pt states pain worse with lifting anything heavy at this time.

## 2019-11-10 NOTE — ED Provider Notes (Signed)
[facility] Emergency Department Provider Note       Time seen: ----------------------------------------- 4:56 PM on 11/10/2019 -----------------------------------------   I have reviewed the triage vital signs and the nursing notes.  HISTORY   Chief Complaint Elbow Pain    HPI Monica Pham is a 49 y.o. female with a history of asthma, COPD who presents to the ED for right elbow pain.  Patient states the pain is worse with lifting anything heavy at this time.  Discomfort is 4 out of 10 in the right elbow.  Past Medical History:  Diagnosis Date  . Asthma   . COPD (chronic obstructive pulmonary disease) (HCC)     There are no problems to display for this patient.   Past Surgical History:  Procedure Laterality Date  . BACK SURGERY    . TUBAL LIGATION      Allergies Codeine  Social History Social History   Tobacco Use  . Smoking status: Current Every Day Smoker    Packs/day: 0.50  . Smokeless tobacco: Never Used  Substance Use Topics  . Alcohol use: No  . Drug use: No    Review of Systems Constitutional: Negative for fever. Cardiovascular: Negative for chest pain. Respiratory: Negative for shortness of breath. Gastrointestinal: Negative for abdominal pain Musculoskeletal: Positive for right elbow pain Skin: Negative for rash. Neurological: Negative for headaches, focal weakness or numbness.  All systems negative/normal/unremarkable except as stated in the HPI  ____________________________________________   PHYSICAL EXAM:  VITAL SIGNS: ED Triage Vitals  Enc Vitals Group     BP 11/10/19 1625 (!) 150/74     Pulse Rate 11/10/19 1625 81     Resp 11/10/19 1625 20     Temp 11/10/19 1625 97.7 F (36.5 C)     Temp Source 11/10/19 1625 Oral     SpO2 11/10/19 1625 99 %     Weight 11/10/19 1618 157 lb 11.2 oz (71.5 kg)     Height 11/10/19 1618 5' 3.5" (1.613 m)     Head Circumference --      Peak Flow --      Pain Score  11/10/19 1619 4     Pain Loc --      Pain Edu? --      Excl. in [place]? --     Constitutional: Alert and oriented. Well appearing and in no distress. Musculoskeletal: Pain with range of motion of the right elbow, lateral right elbow tenderness consistent with lateral epicondylitis Neurologic:  Normal speech and language. No gross focal neurologic deficits are appreciated.  Skin:  Skin is warm, dry and intact. No rash noted. Psychiatric: Mood and affect are normal. Speech and behavior are normal.  ____________________________________________  ED COURSE:  As part of my medical decision making, I reviewed the following data within the [place] History obtained from family if available, nursing notes, old chart and ekg, as well as notes from prior ED visits. Patient presented for right elbow pain, we will assess with labs and imaging as indicated at this time.   Procedures  Monica Pham was evaluated in Emergency Department on 11/10/2019 for the symptoms described in the history of present illness. She was evaluated in the context of the global COVID-19 pandemic, which necessitated consideration that the patient might be at risk for infection with the SARS-CoV-2 virus that causes COVID-19. Institutional protocols and algorithms that pertain to the evaluation of patients at risk for COVID-19 are in a state of rapid change  based on information released by regulatory bodies including the CDC and federal and state organizations. These policies and algorithms were followed during the patient's care in the ED.  ____________________________________________   RADIOLOGY Images were viewed by me  Right elbow x-ray Is unremarkable ____________________________________________   DIFFERENTIAL DIAGNOSIS   Tendinitis, fracture unlikely, bursitis, contusion  FINAL ASSESSMENT AND PLAN  Right elbow pain   Plan: The patient had presented for right elbow pain which is likely either  tendinitis or bursitis.  Patient's imaging did not reveal any acute process.  Should be encouraged to use anti-inflammatory medicine and physical therapy.  She is cleared for outpatient follow-up.   Laurence Aly, MD    Note: This note was generated in part or whole with voice recognition software. Voice recognition is usually quite accurate but there are transcription errors that can and very often do occur. I apologize for any typographical errors that were not detected and corrected.     Earleen Newport, MD 11/10/19 [phone]

## 2020-01-22 ENCOUNTER — Encounter: Payer: Self-pay | Admitting: Emergency Medicine

## 2020-01-22 ENCOUNTER — Other Ambulatory Visit: Payer: Self-pay

## 2020-01-22 ENCOUNTER — Emergency Department
Admission: EM | Admit: 2020-01-22 | Discharge: 2020-01-22 | Disposition: A | Discharge status: Home / Self Care | Payer: Self-pay | Attending: Emergency Medicine | Admitting: Emergency Medicine

## 2020-01-22 DIAGNOSIS — F17200 Nicotine dependence, unspecified, uncomplicated: Secondary | ICD-10-CM | POA: Insufficient documentation

## 2020-01-22 DIAGNOSIS — J449 Chronic obstructive pulmonary disease, unspecified: Secondary | ICD-10-CM | POA: Insufficient documentation

## 2020-01-22 DIAGNOSIS — N764 Abscess of vulva: Secondary | ICD-10-CM | POA: Insufficient documentation

## 2020-01-22 MED ORDER — HYDROCODONE-ACETAMINOPHEN 5-325 MG PO TABS: 1.0000 | ORAL_TABLET | Freq: Four times a day (QID) | ORAL | 0 refills | Status: DC | PRN

## 2020-01-22 MED ORDER — LIDOCAINE HCL 4 % EX SOLN: Freq: Once | CUTANEOUS | Status: DC

## 2020-01-22 MED ORDER — OXYCODONE-ACETAMINOPHEN 5-325 MG PO TABS
1.0000 | ORAL_TABLET | Freq: Once | ORAL | Status: AC
  Administered 2020-01-22: 1 via ORAL
  Filled 2020-01-22: qty 1

## 2020-01-22 MED ORDER — LIDOCAINE-PRILOCAINE 2.5-2.5 % EX CREA
TOPICAL_CREAM | Freq: Once | CUTANEOUS | Status: AC
  Filled 2020-01-22: qty 5

## 2020-01-22 MED ORDER — CLINDAMYCIN HCL 150 MG PO CAPS: 300.0000 mg | ORAL_CAPSULE | Freq: Three times a day (TID) | ORAL | 0 refills | Status: DC

## 2020-01-22 MED ORDER — CLINDAMYCIN HCL 150 MG PO CAPS
300.0000 mg | ORAL_CAPSULE | Freq: Once | ORAL | Status: AC
  Administered 2020-01-22: 300 mg via ORAL
  Filled 2020-01-22: qty 2

## 2020-01-22 MED ORDER — LIDOCAINE HCL (PF) 1 % IJ SOLN
5.0000 mL | Freq: Once | INTRAMUSCULAR | Status: AC
  Administered 2020-01-22: 5 mL via INTRADERMAL
  Filled 2020-01-22: qty 5

## 2020-01-22 NOTE — Discharge Instructions (Signed)
Follow-up with your regular doctor if not improving in 3 days.  Return emergency department worsening. Soak in a warm tub of water. Take your antibiotic as prescribed Pain medication if needed

## 2020-01-22 NOTE — ED Provider Notes (Signed)
[facility] Emergency Department Provider Note  ____________________________________________   First MD Initiated Contact with Patient 01/22/20 1542     (approximate)  I have reviewed the triage vital signs and the nursing notes.   HISTORY  Chief Complaint Abscess    HPI Monica Pham is a 49 y.o. female presents emergency department with an abscess to the left labia.  Patient states she gets this on and off but it has gotten much larger today.  No fever or chills.  No drainage from site.  No vaginal discharge or concerns for STD.   Pain scale is 3/10   Past Medical History:  Diagnosis Date  . Asthma   . COPD (chronic obstructive pulmonary disease) (HCC)     There are no problems to display for this patient.   Past Surgical History:  Procedure Laterality Date  . BACK SURGERY    . TUBAL LIGATION      Prior to Admission medications   Medication Sig Start Date End Date Taking? Authorizing Provider  albuterol (PROVENTIL HFA;VENTOLIN HFA) 108 (90 BASE) MCG/ACT inhaler Inhale 2 puffs into the lungs every 6 (six) hours as needed for shortness of breath.    [provider]  clindamycin (CLEOCIN) 150 MG capsule Take 2 capsules (300 mg total) by mouth 3 (three) times daily. 01/22/20   Faustino Luecke, Linden Dolin, PA-C  HYDROcodone-acetaminophen (NORCO/VICODIN) 5-325 MG tablet Take 1 tablet by mouth every 6 (six) hours as needed for moderate pain. 01/22/20   Versie Starks, PA-C    Allergies Codeine  Family History  Problem Relation Age of Onset  . Breast cancer Neg Hx     Social History Social History   Tobacco Use  . Smoking status: Current Every Day Smoker    Packs/day: 0.50  . Smokeless tobacco: Never Used  Substance Use Topics  . Alcohol use: No  . Drug use: No    Review of Systems  Constitutional: No fever/chills Eyes: No visual changes. ENT: No sore throat. Respiratory: Denies cough Genitourinary: Negative for dysuria.  Still  for labial abscess Musculoskeletal: Negative for back pain. Skin: Negative for rash. Psychiatric: no mood changes,     ____________________________________________   PHYSICAL EXAM:  VITAL SIGNS: ED Triage Vitals [01/22/20 1530]  Enc Vitals Group     BP (!) 149/93     Pulse Rate 87     Resp 16     Temp 99.2 F (37.3 C)     Temp Source Oral     SpO2 100 %     Weight 167 lb (75.8 kg)     Height 5\' 3"  (1.6 m)     Head Circumference      Peak Flow      Pain Score 3     Pain Loc      Pain Edu?      Excl. in [place]?     Constitutional: Alert and oriented. Well appearing and in no acute distress. Eyes: Conjunctivae are normal.  Head: Atraumatic. Nose: No congestion/rhinnorhea. Mouth/Throat: Mucous membranes are moist.   Neck:  supple no lymphadenopathy noted Cardiovascular: Normal rate, regular rhythm.  Respiratory: Normal respiratory effort.  No retractions,  GU: Left labia has a external abscess noted on the edge of the left labia, it does not extend into the vaginal vault or look like a Bartholin cyst Musculoskeletal: FROM all extremities, warm and well perfused Neurologic:  Normal speech and language.  Skin:  Skin is warm, dry and intact.  No rash noted. Psychiatric: Mood and affect are normal. Speech and behavior are normal.  ____________________________________________   LABS (all labs ordered are listed, but only abnormal results are displayed)  Labs Reviewed - No data to display ____________________________________________   ____________________________________________  RADIOLOGY    ____________________________________________   PROCEDURES  Procedure(s) performed:   Marland KitchenMarland KitchenIncision and Drainage  Date/Time: 01/22/2020 5:02 PM Performed by: Versie Starks, PA-C Authorized by: Versie Starks, PA-C   Consent:    Consent obtained:  Verbal   Consent given by:  Patient   Risks discussed:  Bleeding, incomplete drainage, pain and infection   Alternatives  discussed:  Delayed treatment Location:    Type:  Abscess   Location:  Anogenital Pre-procedure details:    Skin preparation:  Betadine Anesthesia (see MAR for exact dosages):    Anesthesia method:  Topical application and local infiltration   Topical anesthetic:  EMLA cream   Local anesthetic:  Lidocaine 1% w/o epi Procedure type:    Complexity:  Simple Procedure details:    Incision types:  Stab incision   Incision depth:  Dermal   Scalpel blade:  11   Wound management:  Probed and deloculated and irrigated with saline   Drainage:  Bloody and purulent   Drainage amount:  Moderate   Wound treatment:  Wound left open Post-procedure details:    Patient tolerance of procedure:  Tolerated well, no immediate complications Comments:     Abscesses on the left labia      ____________________________________________   INITIAL IMPRESSION / ASSESSMENT AND PLAN / ED COURSE  Pertinent labs & imaging results that were available during my care of the patient were reviewed by me and considered in my medical decision making (see chart for details).   Patient is a 49 year old female presents emergency department with concerns of a labial abscess.  See HPI.  Physical exam does show a labial abscess.  See procedure note for incision and drainage.   Patient was given Percocet p.o., prescription for clindamycin and Vicodin.  2 days off of work.  She was encouraged to continue to soak in a warm tub of water, return to the emergency department worsening.  Follow-up with regular doctor if not better in 3 days.  She states she understands.  She is discharged stable condition.  Monica Pham was evaluated in Emergency Department on 01/22/2020 for the symptoms described in the history of present illness. She was evaluated in the context of the global COVID-19 pandemic, which necessitated consideration that the patient might be at risk for infection with the SARS-CoV-2 virus that causes COVID-19.  Institutional protocols and algorithms that pertain to the evaluation of patients at risk for COVID-19 are in a state of rapid change based on information released by regulatory bodies including the CDC and federal and state organizations. These policies and algorithms were followed during the patient's care in the ED.   As part of my medical decision making, I reviewed the following data within the [place] notes reviewed and incorporated, Old chart reviewed, Notes from prior ED visits and [place] Controlled Substance Database  ____________________________________________   FINAL CLINICAL IMPRESSION(S) / ED DIAGNOSES  Final diagnoses:  Left genital labial abscess      NEW MEDICATIONS STARTED DURING THIS VISIT:  New Prescriptions   CLINDAMYCIN (CLEOCIN) 150 MG CAPSULE    Take 2 capsules (300 mg total) by mouth 3 (three) times daily.   HYDROCODONE-ACETAMINOPHEN (NORCO/VICODIN) 5-325 MG TABLET  Take 1 tablet by mouth every 6 (six) hours as needed for moderate pain.     Note:  This document was prepared using Dragon voice recognition software and may include unintentional dictation errors.    Versie Starks, PA-C 01/22/20 1704    Earleen Newport, MD 01/22/20 2031

## 2020-01-22 NOTE — ED Triage Notes (Signed)
Here for abscess to left labia, hx of same.  Pt denies fevers at home. NAD. VSS. Reports has been "on and off for a month".  Pt informs RN that abscess seemed to come and go over last month but now is bigger.

## 2020-01-22 NOTE — ED Notes (Signed)
See triage note   States she noticed a possible abscess area to left groin   States te swelling in moving into her labia

## 2020-11-13 ENCOUNTER — Ambulatory Visit
Admission: RE | Admit: 2020-11-13 | Discharge: 2020-11-13 | Disposition: A | Discharge status: Home / Self Care | Payer: Self-pay | Source: Ambulatory Visit | Attending: Oncology | Admitting: Oncology

## 2020-11-13 ENCOUNTER — Other Ambulatory Visit: Payer: Self-pay

## 2020-11-13 ENCOUNTER — Ambulatory Visit: Payer: Self-pay | Attending: Oncology

## 2020-11-13 VITALS — BP 122/83 | HR 73 | Temp 97.8°F | Ht 63.0 in | Wt 158.2 lb

## 2020-11-13 DIAGNOSIS — Z0000 Encounter for general adult medical examination without abnormal findings: Secondary | ICD-10-CM

## 2020-11-13 NOTE — Progress Notes (Signed)
  Subjective:     Patient ID: Monica Pham, female   DOB: 11/28/1970, 50 y.o.   MRN: [id number]  HPI   Review of Systems     Objective:   Physical Exam Chest:  Breasts:     Right: No swelling, bleeding, inverted nipple, mass, nipple discharge, skin change or tenderness.     Left: No swelling, bleeding, inverted nipple, mass, nipple discharge, skin change or tenderness.      Comments: Chest tattoo       Assessment:     50 year old patient presents for [facility] clinic visit.  Patient screened, and meets BCCCP eligibility.  Patient does not have insurance, Medicare or Medicaid. Instructed patient on breast self awareness using teach back methodClinical breast exam unremarkable.  Next pap due 04/2021.  Patient requested to hav pap either next year or with her primary provder later this year. Risk Assessment    Risk Scores      11/13/2020 11/01/2019   Last edited by: Rico Junker, RN Rico Junker, RN   5-year risk: 0.7 % 0.6 %   Lifetime risk: 6.7 % 6.8 %            Plan:     Sent for bilateral screening mammogram

## 2020-11-15 ENCOUNTER — Other Ambulatory Visit: Payer: Self-pay

## 2020-11-15 DIAGNOSIS — N63 Unspecified lump in unspecified breast: Secondary | ICD-10-CM

## 2020-11-26 ENCOUNTER — Ambulatory Visit
Admission: RE | Admit: 2020-11-26 | Discharge: 2020-11-26 | Disposition: A | Discharge status: Home / Self Care | Payer: Self-pay | Source: Ambulatory Visit | Attending: Oncology | Admitting: Oncology

## 2020-11-26 ENCOUNTER — Other Ambulatory Visit: Payer: Self-pay

## 2020-11-26 DIAGNOSIS — N63 Unspecified lump in unspecified breast: Secondary | ICD-10-CM

## 2020-11-29 NOTE — Progress Notes (Signed)
Letter mailed from [facility] to notify of normal mammogram results.  Patient to return in one year for annual screening.  Copy to [facility]. 

## 2021-04-01 ENCOUNTER — Other Ambulatory Visit: Payer: Self-pay

## 2021-04-01 ENCOUNTER — Encounter: Payer: Self-pay | Admitting: Emergency Medicine

## 2021-04-01 ENCOUNTER — Emergency Department
Admission: EM | Admit: 2021-04-01 | Discharge: 2021-04-01 | Disposition: A | Discharge status: Home / Self Care | Payer: 59 | Attending: Emergency Medicine | Admitting: Emergency Medicine

## 2021-04-01 DIAGNOSIS — J45909 Unspecified asthma, uncomplicated: Secondary | ICD-10-CM | POA: Insufficient documentation

## 2021-04-01 DIAGNOSIS — F17210 Nicotine dependence, cigarettes, uncomplicated: Secondary | ICD-10-CM | POA: Diagnosis not present

## 2021-04-01 DIAGNOSIS — M720 Palmar fascial fibromatosis [Dupuytren]: Secondary | ICD-10-CM | POA: Diagnosis not present

## 2021-04-01 DIAGNOSIS — J449 Chronic obstructive pulmonary disease, unspecified: Secondary | ICD-10-CM | POA: Insufficient documentation

## 2021-04-01 DIAGNOSIS — M79641 Pain in right hand: Secondary | ICD-10-CM | POA: Diagnosis present

## 2021-04-01 NOTE — ED Triage Notes (Signed)
C/O right hand pain.  Describes pain to palm of hand radiating up toward fourth finger.  Unable to straighten fourth finger.  States symptoms have been ongoing for a while

## 2021-04-01 NOTE — Discharge Instructions (Signed)
You should follow-up with Ortho for ongoing evaluation and management. Take OTC Aleve as needed.

## 2021-04-01 NOTE — ED Notes (Signed)
See triage note Presents with pain to palm of right hand  States pain goes from palm into 4 th finger   Unknown injury

## 2021-04-01 NOTE — ED Provider Notes (Signed)
[facility] Emergency Department Provider Note ____________________________________________  Time seen: 0818  I have reviewed the triage vital signs and the nursing notes.  HISTORY  Chief Complaint  Hand Pain   HPI Monica Pham is a 50 y.o.right-handed female presents to the ED for evaluation of right palm pain at the ring finger.  Patient describes stiffness and tightness with range of motion of the ring finger.  She denies any recent injury, trauma or fall.  Past Medical History:  Diagnosis Date   Asthma    COPD (chronic obstructive pulmonary disease) ([place])     There are no problems to display for this patient.   Past Surgical History:  Procedure Laterality Date   BACK SURGERY     TUBAL LIGATION      Prior to Admission medications   Medication Sig Start Date End Date Taking? Authorizing Provider  albuterol (PROVENTIL HFA;VENTOLIN HFA) 108 (90 BASE) MCG/ACT inhaler Inhale 2 puffs into the lungs every 6 (six) hours as needed for shortness of breath.    [provider]    Allergies Codeine  Family History  Problem Relation Age of Onset   Breast cancer Neg Hx     Social History Social History   Tobacco Use   Smoking status: Every Day    Packs/day: 0.50    Types: Cigarettes   Smokeless tobacco: Never  Substance Use Topics   Alcohol use: No   Drug use: No    Review of Systems  Constitutional: Negative for fever. Cardiovascular: Negative for chest pain. Respiratory: Negative for shortness of breath. Gastrointestinal: Negative for abdominal pain, vomiting and diarrhea. Genitourinary: Negative for dysuria. Musculoskeletal: Negative for back pain.  Right hand pain and stiffness as above. Skin: Negative for rash. Neurological: Negative for headaches, focal weakness or numbness. ____________________________________________  PHYSICAL EXAM:  VITAL SIGNS: ED Triage Vitals  Enc Vitals Group     BP 04/01/21 0746 (!) 141/98      Pulse Rate 04/01/21 0746 83     Resp 04/01/21 0746 18     Temp 04/01/21 0746 98.1 F (36.7 C)     Temp Source 04/01/21 0746 Oral     SpO2 04/01/21 0746 98 %     Weight 04/01/21 0740 158 lb 4.6 oz (71.8 kg)     Height 04/01/21 0740 '5\' 3"'$  (1.6 m)     Head Circumference --      Peak Flow --      Pain Score 04/01/21 0740 6     Pain Loc --      Pain Edu? --      Excl. in [place]? --     Constitutional: Alert and oriented. Well appearing and in no distress. Head: Normocephalic and atraumatic. Eyes: Conjunctivae are normal. Normal extraocular movements Cardiovascular: Normal rate, regular rhythm. Normal distal pulses. Respiratory: Normal respiratory effort. No wheezes/rales/rhonchi. Musculoskeletal: Right hand without obvious deformity, dislocation, joint effusion.  Patient is tender to palpation to the flexor tendon from the palmar MCP proximally of the ring finger.  Flexion and extension range to the fingers is normal, but tender.  No ratcheting or triggering of the digits appreciated.  Nontender with normal range of motion in all extremities.  Neurologic:  Normal gait without ataxia. Normal speech and language. No gross focal neurologic deficits are appreciated. Skin:  Skin is warm, dry and intact. No rash noted. Psychiatric: Mood and affect are normal. Patient exhibits appropriate insight and judgment. ____________________________________________    {LABS (pertinent positives/negatives)  ____________________________________________  {EKG  ____________________________________________   RADIOLOGY Official radiology report(s): No results found. ____________________________________________  PROCEDURES   Procedures ____________________________________________   INITIAL IMPRESSION / ASSESSMENT AND PLAN / ED COURSE  As part of my medical decision making, I reviewed the following data within the electronic MEDICAL RECORD NUMBER Notes from prior ED visits      DDX: finger sprain,  finger dislocation, trigger digit, Dupuytren's contracture    Patient ED evaluation of increasing right palm pain at the ring finger.  Patient clinical picture is consistent with a probable early Dupuytren's contracture.  No evidence of any acute fracture, dislocation, trauma.  Patient is advised to follow-up with orthopedics for ongoing evaluation and management.  Return precautions of been reviewed.  PETA LACKI was evaluated in Emergency Department on 04/01/2021 for the symptoms described in the history of present illness. She was evaluated in the context of the global COVID-19 pandemic, which necessitated consideration that the patient might be at risk for infection with the SARS-CoV-2 virus that causes COVID-19. Institutional protocols and algorithms that pertain to the evaluation of patients at risk for COVID-19 are in a state of rapid change based on information released by regulatory bodies including the CDC and federal and state organizations. These policies and algorithms were followed during the patient's care in the ED. ____________________________________________  FINAL CLINICAL IMPRESSION(S) / ED DIAGNOSES  Final diagnoses:  Dupuytren's disease of palm of right hand      Carmie End, Dannielle Karvonen, PA-C 04/01/21 1706    Duffy Bruce, MD 04/02/21 [phone]

## 2021-04-04 ENCOUNTER — Ambulatory Visit (INDEPENDENT_AMBULATORY_CARE_PROVIDER_SITE_OTHER): Payer: 59 | Admitting: Family Medicine

## 2021-04-04 ENCOUNTER — Encounter: Payer: Self-pay | Admitting: Family Medicine

## 2021-04-04 ENCOUNTER — Other Ambulatory Visit: Payer: Self-pay

## 2021-04-04 DIAGNOSIS — M65341 Trigger finger, right ring finger: Secondary | ICD-10-CM

## 2021-04-04 DIAGNOSIS — M79641 Pain in right hand: Secondary | ICD-10-CM | POA: Diagnosis not present

## 2021-04-04 NOTE — Progress Notes (Signed)
   Office Visit Note   Patient: Monica Pham           Date of Birth: 01/12/1971           MRN: [id number] Visit Date: 04/04/2021 Requested by: [facility], [facility] [address],  [place] [address] PCP: [facility], [place]  Subjective: Chief Complaint  Patient presents with   Right Hand - Pain    Pain in the palm and into the ring finger x 1 week. NKI. Was seen recently in the ED for this. Stiffness in the hand -- cannot make a fist sometimes. Works as a Educational psychologist. Right-hand dominant.     HPI: She is here with right hand pain.  Symptoms started a week ago, no injury.  She works at [employer] and does a lot of repetitive use with her hands.  She has been diagnosed with carpal tunnel syndrome years ago and it still intermittently bothers her but this pain is new.  Sometimes her finger gets stuck in flexion.  The pain is mostly on the palm of her hand and involving the fourth finger.  She is right-hand dominant.               ROS:   All other systems were reviewed and are negative.  Objective: Vital Signs: LMP 06/10/2018   Physical Exam:  General:  Alert and oriented, in no acute distress. Pulm:  Breathing unlabored. Psy:  Normal mood, congruent affect. Skin: No erythema Right hand: She does have positive Tinel's of the carpal tunnel and positive Phalen's test.  No thenar atrophy.  She is tender at the A1 pulley of the fourth finger and this reproduces her pain.  There is no active triggering today but there is a nodularity there.    Imaging: No results found.  Assessment & Plan: Right hand pain, suspect mainly due to fourth trigger finger.  She has underlying carpal tunnel syndrome as well. -Discussed various options and elected to splint the fourth finger in extension and use ice applications a couple times per day for the next week or so.  If symptoms persist, could refer her to hand therapy or else try cortisone  injection.     Procedures: No procedures performed        PMFS History: There are no problems to display for this patient.  Past Medical History:  Diagnosis Date   Asthma    COPD (chronic obstructive pulmonary disease) ([place])     Family History  Problem Relation Age of Onset   Breast cancer Neg Hx     Past Surgical History:  Procedure Laterality Date   BACK SURGERY     TUBAL LIGATION     Social History   Occupational History   Not on file  Tobacco Use   Smoking status: Every Day    Packs/day: 0.50    Types: Cigarettes   Smokeless tobacco: Never  Substance and Sexual Activity   Alcohol use: No   Drug use: No   Sexual activity: Not on file

## 2021-10-13 ENCOUNTER — Other Ambulatory Visit: Payer: Self-pay | Admitting: Family Medicine

## 2021-10-14 ENCOUNTER — Encounter: Payer: Self-pay | Admitting: Family Medicine

## 2021-12-23 ENCOUNTER — Ambulatory Visit: Payer: 59

## 2022-01-05 ENCOUNTER — Other Ambulatory Visit: Payer: Self-pay

## 2022-01-05 ENCOUNTER — Emergency Department: Payer: Medicaid Other

## 2022-01-05 ENCOUNTER — Emergency Department
Admission: EM | Admit: 2022-01-05 | Discharge: 2022-01-05 | Disposition: A | Discharge status: Home / Self Care | Payer: Medicaid Other | Attending: Emergency Medicine | Admitting: Emergency Medicine

## 2022-01-05 ENCOUNTER — Encounter: Payer: Self-pay | Admitting: Emergency Medicine

## 2022-01-05 DIAGNOSIS — F17200 Nicotine dependence, unspecified, uncomplicated: Secondary | ICD-10-CM | POA: Insufficient documentation

## 2022-01-05 DIAGNOSIS — R0789 Other chest pain: Secondary | ICD-10-CM | POA: Insufficient documentation

## 2022-01-05 DIAGNOSIS — J45909 Unspecified asthma, uncomplicated: Secondary | ICD-10-CM | POA: Insufficient documentation

## 2022-01-05 DIAGNOSIS — J449 Chronic obstructive pulmonary disease, unspecified: Secondary | ICD-10-CM | POA: Insufficient documentation

## 2022-01-05 DIAGNOSIS — R059 Cough, unspecified: Secondary | ICD-10-CM | POA: Insufficient documentation

## 2022-01-05 DIAGNOSIS — R079 Chest pain, unspecified: Secondary | ICD-10-CM

## 2022-01-05 LAB — BASIC METABOLIC PANEL
Anion gap: 8 (ref 5–15)
BUN: 12 mg/dL (ref 6–20)
CO2: 28 mmol/L (ref 22–32)
Calcium: 8.7 mg/dL — ABNORMAL LOW (ref 8.9–10.3)
Chloride: 104 mmol/L (ref 98–111)
Creatinine, Ser: 1.01 mg/dL — ABNORMAL HIGH (ref 0.44–1.00)
GFR, Estimated: >60 mL/min (ref >60)
Glucose, Bld: 99 mg/dL (ref 70–99)
Potassium: 4.3 mmol/L (ref 3.5–5.1)
Sodium: 140 mmol/L (ref 135–145)

## 2022-01-05 LAB — CBC
HCT: 38.3 % (ref 36.0–46.0)
Hemoglobin: 13 g/dL (ref 12.0–15.0)
MCH: 30.7 pg (ref 26.0–34.0)
MCHC: 33.9 g/dL (ref 30.0–36.0)
MCV: 90.5 fL (ref 80.0–100.0)
Platelets: 319 10*3/uL (ref 150–400)
RBC: 4.23 MIL/uL (ref 3.87–5.11)
RDW: 12 % (ref 11.5–15.5)
WBC: 9.6 10*3/uL (ref 4.0–10.5)
nRBC: 0 % (ref 0.0–0.2)

## 2022-01-05 LAB — TROPONIN I (HIGH SENSITIVITY)
Troponin I (High Sensitivity): 4 ng/L (ref <18)
Troponin I (High Sensitivity): 4 ng/L (ref <18)

## 2022-01-05 LAB — D-DIMER, QUANTITATIVE: D-Dimer, Quant: <0.27 ug/mL-FEU (ref 0.00–0.50)

## 2022-01-05 MED ORDER — PREDNISONE 10 MG (21) PO TBPK: ORAL_TABLET | ORAL | 0 refills | Status: DC

## 2022-01-05 MED ORDER — AMOXICILLIN-POT CLAVULANATE 875-125 MG PO TABS: 1.0000 | ORAL_TABLET | Freq: Two times a day (BID) | ORAL | 0 refills | Status: AC

## 2022-01-05 NOTE — ED Triage Notes (Signed)
Pt to ED via POV, pt states that she was at work when she started having pain in the left side of her chest. Pt states that the pain is not constant. Pt reports that sometimes the pain is worse with a deep breath. Pt denies any other symptoms at this time. Pt states that she does have a history of high cholesterol and family history of cardiac issues but she does not have any cardiac history ?

## 2022-01-05 NOTE — ED Provider Notes (Signed)
? ?[facility] ?Provider Note ? ?Patient Contact: 5:27 PM (approximate) ? ? ?History  ? ?Chest Pain ? ? ?HPI ? ?Monica Pham is a 51 y.o. female with a history of asthma and COPD presents to the emergency department with pleuritic left-sided chest pain for the past 2 days.  Patient is a daily smoker.  She denies recent surgery or prolonged immobilization.  Denies a history of similar pain in the past.  She denies a history of cardiac issues but states that she has a extensive family history of MIs.  She states that pain seems to come and go and radiates along her left lateral chest.  No nausea, vomiting or abdominal pain.  No falls or other mechanisms of trauma. ? ?  ? ? ?Physical Exam  ? ?Triage Vital Signs: ?ED Triage Vitals  ?Enc Vitals Group  ?   BP 01/05/22 1604 135/85  ?   Pulse Rate 01/05/22 1604 80  ?   Resp 01/05/22 1604 16  ?   Temp 01/05/22 1604 98.7 ?F (37.1 ?C)  ?   Temp Source 01/05/22 1604 Oral  ?   SpO2 01/05/22 1604 95 %  ?   Weight 01/05/22 1605 158 lb (71.7 kg)  ?   Height 01/05/22 1605 '5\' 3"'$  (1.6 m)  ?   Head Circumference --   ?   Peak Flow --   ?   Pain Score 01/05/22 1604 4  ?   Pain Loc --   ?   Pain Edu? --   ?   Excl. in [place]? --   ? ? ?Most recent vital signs: ?Vitals:  ? 01/05/22 1604  ?BP: 135/85  ?Pulse: 80  ?Resp: 16  ?Temp: 98.7 ?F (37.1 ?C)  ?SpO2: 95%  ? ? ? ?General: Alert and in no acute distress. ?Eyes:  PERRL. EOMI. ?Head: No acute traumatic findings ?ENT: ?     Ears: Tms are pearly.  ?     Nose: No congestion/rhinnorhea. ?     Mouth/Throat: Mucous membranes are moist.  ?Neck: No stridor. No cervical spine tenderness to palpation. ?Cardiovascular:  Good peripheral perfusion ?Respiratory: Normal respiratory effort without tachypnea or retractions. Lungs CTAB. Good air entry to the bases with no decreased or absent breath sounds. ?Gastrointestinal: Bowel sounds ?4 quadrants. Soft and nontender to palpation. No guarding or rigidity. No palpable masses. No  distention. No CVA tenderness. ?Musculoskeletal: Full range of motion to all extremities.  ?Neurologic:  No gross focal neurologic deficits are appreciated.  ?Skin:   No rash noted ?Other: ? ? ?ED Results / Procedures / Treatments  ? ?Labs ?(all labs ordered are listed, but only abnormal results are displayed) ?Labs Reviewed  ?BASIC METABOLIC PANEL - Abnormal; Notable for the following components:  ?    Result Value  ? Creatinine, Ser 1.01 (*)   ? Calcium 8.7 (*)   ? All other components within normal limits  ?CBC  ?D-DIMER, QUANTITATIVE  ?TROPONIN I (HIGH SENSITIVITY)  ?TROPONIN I (HIGH SENSITIVITY)  ? ? ? ?EKG ? ?Normal sinus rhythm without ST segment elevation or other apparent arrhythmia. ? ? ?RADIOLOGY ? ?I personally viewed and evaluated these images as part of my medical decision making, as well as reviewing the written report by the radiologist. ? ?ED Provider Interpretation: Normal sinus rhythm without ST segment elevation or other apparent arrhythmia. ? ? ?PROCEDURES: ? ?Critical Care performed: No ? ?Procedures ? ? ?MEDICATIONS ORDERED IN ED: ?Medications - No data to display ? ? ?  IMPRESSION / MDM / ASSESSMENT AND PLAN / ED COURSE  ?I reviewed the triage vital signs and the nursing notes. ?             ?               ? ?Assessment and plan: ?Chest pain:  ?51 year old female presents to the emergency department with pleuritic left-sided chest pain for the past 2 days with associated cough and some intermittent shortness of breath. ? ?Vital signs are reassuring at triage.  On physical exam, patient was alert and nontoxic-appearing with no perceived increased work of breathing and no adventitious lung sounds were auscultated. ? ?CBC, CMP, troponin unremarkable.  Will obtain second troponin and D-dimer and will reassess.  EKG indicated normal sinus rhythm without ST segment elevation or other apparent arrhythmia. ? ?Delta troponin within range.  D-dimer within range.  We will treat patient with prednisone  and Augmentin for possible bronchitis.  Upon recheck, patient reported that her pain and improved.  A work note was provided and return precautions were given. ? ? ?FINAL CLINICAL IMPRESSION(S) / ED DIAGNOSES  ? ?Final diagnoses:  ?Chest pain, unspecified type  ? ? ? ?Rx / DC Orders  ? ?ED Discharge Orders   ? ?      Ordered  ?  amoxicillin-clavulanate (AUGMENTIN) 875-125 MG tablet  2 times daily       ? 01/05/22 1814  ?  predniSONE (STERAPRED UNI-PAK 21 TAB) 10 MG (21) TBPK tablet       ? 01/05/22 1814  ? ?  ?  ? ?  ? ? ? ?Note:  This document was prepared using Dragon voice recognition software and may include unintentional dictation errors. ?  ?Lannie Fields, PA-C ?01/05/22 2327 ? ?  ?Blake Divine, MD ?01/05/22 2333 ? ?

## 2022-01-05 NOTE — Discharge Instructions (Signed)
Take tapered prednisone and Augmentin. ?

## 2022-01-28 ENCOUNTER — Other Ambulatory Visit: Payer: Self-pay

## 2022-01-28 DIAGNOSIS — Z1211 Encounter for screening for malignant neoplasm of colon: Secondary | ICD-10-CM

## 2022-01-28 DIAGNOSIS — Z1231 Encounter for screening mammogram for malignant neoplasm of breast: Secondary | ICD-10-CM

## 2022-01-29 ENCOUNTER — Other Ambulatory Visit: Payer: Self-pay | Admitting: Family Medicine

## 2022-01-29 DIAGNOSIS — Z1231 Encounter for screening mammogram for malignant neoplasm of breast: Secondary | ICD-10-CM

## 2022-02-03 ENCOUNTER — Ambulatory Visit
Admission: RE | Admit: 2022-02-03 | Discharge: 2022-02-03 | Disposition: A | Discharge status: Home / Self Care | Payer: Medicaid Other | Source: Ambulatory Visit | Attending: Family Medicine | Admitting: Family Medicine

## 2022-02-03 ENCOUNTER — Ambulatory Visit: Payer: Medicaid Other

## 2022-02-03 DIAGNOSIS — Z1231 Encounter for screening mammogram for malignant neoplasm of breast: Secondary | ICD-10-CM | POA: Insufficient documentation

## 2022-07-14 ENCOUNTER — Encounter: Payer: Self-pay | Admitting: Emergency Medicine

## 2022-07-14 ENCOUNTER — Emergency Department: Payer: BC Managed Care – PPO

## 2022-07-14 ENCOUNTER — Emergency Department
Admission: EM | Admit: 2022-07-14 | Discharge: 2022-07-14 | Disposition: A | Discharge status: Home / Self Care | Payer: BC Managed Care – PPO | Attending: Emergency Medicine | Admitting: Emergency Medicine

## 2022-07-14 DIAGNOSIS — J449 Chronic obstructive pulmonary disease, unspecified: Secondary | ICD-10-CM | POA: Diagnosis not present

## 2022-07-14 DIAGNOSIS — K5792 Diverticulitis of intestine, part unspecified, without perforation or abscess without bleeding: Secondary | ICD-10-CM

## 2022-07-14 DIAGNOSIS — D72829 Elevated white blood cell count, unspecified: Secondary | ICD-10-CM | POA: Insufficient documentation

## 2022-07-14 DIAGNOSIS — K5730 Diverticulosis of large intestine without perforation or abscess without bleeding: Secondary | ICD-10-CM | POA: Insufficient documentation

## 2022-07-14 DIAGNOSIS — R1032 Left lower quadrant pain: Secondary | ICD-10-CM | POA: Diagnosis present

## 2022-07-14 LAB — URINALYSIS, ROUTINE W REFLEX MICROSCOPIC
Bilirubin Urine: NEGATIVE
Glucose, UA: NEGATIVE mg/dL
Hgb urine dipstick: NEGATIVE
Ketones, ur: NEGATIVE mg/dL
Leukocytes,Ua: NEGATIVE
Nitrite: NEGATIVE
Protein, ur: NEGATIVE mg/dL
Specific Gravity, Urine: 1.002 — ABNORMAL LOW (ref 1.005–1.030)
pH: 7 (ref 5.0–8.0)

## 2022-07-14 LAB — CBC
HCT: 38.6 % (ref 36.0–46.0)
Hemoglobin: 13 g/dL (ref 12.0–15.0)
MCH: 30.2 pg (ref 26.0–34.0)
MCHC: 33.7 g/dL (ref 30.0–36.0)
MCV: 89.6 fL (ref 80.0–100.0)
Platelets: 279 10*3/uL (ref 150–400)
RBC: 4.31 MIL/uL (ref 3.87–5.11)
RDW: 11.9 % (ref 11.5–15.5)
WBC: 13.3 10*3/uL — ABNORMAL HIGH (ref 4.0–10.5)
nRBC: 0 % (ref 0.0–0.2)

## 2022-07-14 LAB — COMPREHENSIVE METABOLIC PANEL
ALT: 16 U/L (ref 0–44)
AST: 21 U/L (ref 15–41)
Albumin: 3.9 g/dL (ref 3.5–5.0)
Alkaline Phosphatase: 80 U/L (ref 38–126)
Anion gap: 7 (ref 5–15)
BUN: 12 mg/dL (ref 6–20)
CO2: 28 mmol/L (ref 22–32)
Calcium: 9 mg/dL (ref 8.9–10.3)
Chloride: 104 mmol/L (ref 98–111)
Creatinine, Ser: 0.81 mg/dL (ref 0.44–1.00)
GFR, Estimated: >60 mL/min (ref >60)
Glucose, Bld: 105 mg/dL — ABNORMAL HIGH (ref 70–99)
Potassium: 3.6 mmol/L (ref 3.5–5.1)
Sodium: 139 mmol/L (ref 135–145)
Total Bilirubin: 0.4 mg/dL (ref 0.3–1.2)
Total Protein: 7.4 g/dL (ref 6.5–8.1)

## 2022-07-14 LAB — POC URINE PREG, ED: Preg Test, Ur: NEGATIVE

## 2022-07-14 LAB — LIPASE, BLOOD: Lipase: 42 U/L (ref 11–51)

## 2022-07-14 MED ORDER — AMOXICILLIN-POT CLAVULANATE 875-125 MG PO TABS
1.0000 | ORAL_TABLET | Freq: Once | ORAL | Status: AC
Start: 2022-07-14 — End: 2022-07-14
  Administered 2022-07-14: 1 via ORAL
  Filled 2022-07-14: qty 1

## 2022-07-14 MED ORDER — ONDANSETRON HCL 4 MG/2ML IJ SOLN
4.0000 mg | Freq: Once | INTRAMUSCULAR | Status: AC
  Administered 2022-07-14: 4 mg via INTRAVENOUS
  Filled 2022-07-14: qty 2

## 2022-07-14 MED ORDER — HYDROCODONE-ACETAMINOPHEN 5-325 MG PO TABS: 1.0000 | ORAL_TABLET | ORAL | 0 refills | Status: DC | PRN

## 2022-07-14 MED ORDER — SODIUM CHLORIDE 0.9 % IV BOLUS
1000.0000 mL | Freq: Once | INTRAVENOUS | Status: AC
Start: 2022-07-14 — End: 2022-07-14
  Administered 2022-07-14: 1000 mL via INTRAVENOUS

## 2022-07-14 MED ORDER — HYDROMORPHONE HCL 1 MG/ML IJ SOLN
0.5000 mg | Freq: Once | INTRAMUSCULAR | Status: AC
  Administered 2022-07-14: 0.5 mg via INTRAVENOUS
  Filled 2022-07-14: qty 0.5

## 2022-07-14 MED ORDER — AMOXICILLIN-POT CLAVULANATE 875-125 MG PO TABS: 1.0000 | ORAL_TABLET | Freq: Two times a day (BID) | ORAL | 0 refills | Status: DC

## 2022-07-14 MED ORDER — IOHEXOL 300 MG/ML  SOLN
100.0000 mL | Freq: Once | INTRAMUSCULAR | Status: AC | PRN
Start: 2022-07-14 — End: 2022-07-14
  Administered 2022-07-14: 100 mL via INTRAVENOUS

## 2022-07-14 NOTE — ED Provider Notes (Signed)
[facility] Provider Note    Event Date/Time   First MD Initiated Contact with Patient 07/14/22 0025     (approximate)  History   Chief Complaint: Abdominal Pain  HPI  Monica Pham is a 51 y.o. female with a past medical history of COPD presents emergency department for left lower quadrant abdominal pain.  According to the patient for the past 2 days she has been experiencing left lower quadrant abdominal pain.  Patient states the pain has progressively worsened.  Patient states a history of diverticulitis once previously is which is feels somewhat identical but not as severe as that was.  Denies any urinary symptoms denies any vaginal bleeding or discharge.  No fever.  Physical Exam   Triage Vital Signs: ED Triage Vitals  Enc Vitals Group     BP 07/14/22 0021 (!) 140/81     Pulse Rate 07/14/22 0021 93     Resp 07/14/22 0021 18     Temp 07/14/22 0021 98.4 F (36.9 C)     Temp Source 07/14/22 0021 Oral     SpO2 07/14/22 0021 99 %     Weight 07/14/22 0020 180 lb (81.6 kg)     Height 07/14/22 0020 '5\' 3"'$  (1.6 m)     Head Circumference --      Peak Flow --      Pain Score 07/14/22 0021 5     Pain Loc --      Pain Edu? --      Excl. in [place]? --     Most recent vital signs: Vitals:   07/14/22 0021  BP: (!) 140/81  Pulse: 93  Resp: 18  Temp: 98.4 F (36.9 C)  SpO2: 99%    General: Awake, no distress.  CV:  Good peripheral perfusion.  Regular rate and rhythm  Resp:  Normal effort.  Equal breath sounds bilaterally.  Abd:  No distention.  Soft, moderate left lower quadrant and suprapubic tenderness.  No rebound or guarding.  No right lower quadrant tenderness.   ED Results / Procedures / Treatments   RADIOLOGY  I have reviewed and related CT images.  No sign of bowel obstruction on my evaluation. CT scan read by radiology as consistent with acute diverticulitis without complication.   MEDICATIONS ORDERED IN ED: Medications  ondansetron  (ZOFRAN) injection 4 mg (has no administration in time range)  sodium chloride 0.9 % bolus 1,000 mL (has no administration in time range)  HYDROmorphone (DILAUDID) injection 0.5 mg (has no administration in time range)     IMPRESSION / MDM / ASSESSMENT AND PLAN / ED COURSE  I reviewed the triage vital signs and the nursing notes.  Patient's presentation is most consistent with acute presentation with potential threat to life or bodily function.  Patient presents emergency department for 2 days of worsening left lower quadrant abdominal pain, moderate tenderness to palpation to this area.  We will check labs, urinalysis we will treat pain nausea and IV hydrate.  We will obtain CT imaging to further evaluate.  Differential would include UTI, pyelonephritis, diverticulitis or colitis less likely ovarian pathology.  Patient's labs show a mild leukocytosis otherwise reassuring CBC, normal chemistry, normal urinalysis.  Patient CT scan has resulted consistent with diverticulitis without complication.  This is also consistent with the patient's clinical presentation.  We will place the patient on Augmentin and discharged with a short course of pain medication.  Patient agreeable to plan of care.  Discussed my typical  diverticulitis return precautions.  FINAL CLINICAL IMPRESSION(S) / ED DIAGNOSES   Left lower quadrant abdominal pain Diverticulitis  Rx / DC Orders   Norco Augmentin  Note:  This document was prepared using Dragon voice recognition software and may include unintentional dictation errors.   Harvest Dark, MD 07/14/22 [phone]

## 2022-07-14 NOTE — ED Triage Notes (Signed)
Pt presents via POV with complaints of lower abdominal pain that started 2 days ago. Endorses radiation to the LLQ - has tried OTC pain meds with no relief. Last took motrin around noon. Denies N/V/D, CP or SOB.

## 2022-07-26 ENCOUNTER — Other Ambulatory Visit: Payer: Self-pay

## 2022-07-26 ENCOUNTER — Emergency Department
Admission: EM | Admit: 2022-07-26 | Discharge: 2022-07-26 | Disposition: A | Discharge status: Home / Self Care | Payer: BC Managed Care – PPO | Attending: Emergency Medicine | Admitting: Emergency Medicine

## 2022-07-26 DIAGNOSIS — S6992XA Unspecified injury of left wrist, hand and finger(s), initial encounter: Secondary | ICD-10-CM | POA: Diagnosis present

## 2022-07-26 DIAGNOSIS — J449 Chronic obstructive pulmonary disease, unspecified: Secondary | ICD-10-CM | POA: Diagnosis not present

## 2022-07-26 DIAGNOSIS — S61215A Laceration without foreign body of left ring finger without damage to nail, initial encounter: Secondary | ICD-10-CM | POA: Diagnosis not present

## 2022-07-26 DIAGNOSIS — J45909 Unspecified asthma, uncomplicated: Secondary | ICD-10-CM | POA: Insufficient documentation

## 2022-07-26 DIAGNOSIS — Y990 Civilian activity done for income or pay: Secondary | ICD-10-CM | POA: Insufficient documentation

## 2022-07-26 DIAGNOSIS — W232XXA Caught, crushed, jammed or pinched between a moving and stationary object, initial encounter: Secondary | ICD-10-CM | POA: Diagnosis not present

## 2022-07-26 MED ORDER — LIDOCAINE HCL (PF) 1 % IJ SOLN
5.0000 mL | Freq: Once | INTRAMUSCULAR | Status: DC
Start: 2022-07-26 — End: 2022-07-26
  Filled 2022-07-26: qty 5

## 2022-07-26 NOTE — ED Provider Notes (Signed)
[facility] Provider Note    Event Date/Time   First MD Initiated Contact with Patient 07/26/22 1611     (approximate)   History   Laceration   HPI  Monica Pham is a 51 y.o. female   Past medical history of COPD and asthma who presents to the emergency department with a laceration to her left ring finger sustained at work when she got it caught in a door.  She has full active range of motion and no bony tenderness there is a small laceration to the volar side of the finger.  No other injury sustained.  No other medical complaints.    History was obtained via the patient.      Physical Exam   Triage Vital Signs: ED Triage Vitals  Enc Vitals Group     BP 07/26/22 1515 123/89     Pulse Rate 07/26/22 1515 86     Resp 07/26/22 1515 18     Temp 07/26/22 1515 98.7 F (37.1 C)     Temp Source 07/26/22 1515 Oral     SpO2 07/26/22 1515 100 %     Weight 07/26/22 1448 178 lb 9.2 oz (81 kg)     Height 07/26/22 1448 '5\' 3"'$  (1.6 m)     Head Circumference --      Peak Flow --      Pain Score 07/26/22 1448 0     Pain Loc --      Pain Edu? --      Excl. in [place]? --     Most recent vital signs: Vitals:   07/26/22 1515  BP: 123/89  Pulse: 86  Resp: 18  Temp: 98.7 F (37.1 C)  SpO2: 100%    General: Awake, no distress.  CV:  Good peripheral perfusion.  Resp:  Normal effort.  Abd:  No distention.  Other:  Small 0.5 cm gaping laceration to the volar side of the ring finger of the left hand, no nail involvement no bony tenderness with full active range of motion and sensation distally.   ED Results / Procedures / Treatments   Labs (all labs ordered are listed, but only abnormal results are displayed) Labs Reviewed - No data to display   PROCEDURES:  Critical Care performed: No  ..Laceration Repair  Date/Time: 07/26/2022 4:42 PM  Performed by: Lucillie Garfinkel, MD Authorized by: Lucillie Garfinkel, MD   Consent:    Consent obtained:  Verbal    Consent given by:  Patient   Risks, benefits, and alternatives were discussed: yes     Risks discussed:  Infection, pain, poor cosmetic result and need for additional repair   Alternatives discussed:  No treatment Universal protocol:    Procedure explained and questions answered to patient or proxy's satisfaction: yes     Patient identity confirmed:  Verbally with patient Anesthesia:    Anesthesia method:  Nerve block   Block anesthetic:  Lidocaine 1% w/o epi   Block technique:  Digital block   Block injection procedure:  Anatomic landmarks identified, introduced needle, incremental injection, negative aspiration for blood and anatomic landmarks palpated   Block outcome:  Anesthesia achieved Laceration details:    Location:  Finger   Finger location:  L ring finger   Length (cm):  0.5   Depth (mm):  2 Exploration:    Limited defect created (wound extended): no     Hemostasis achieved with:  Direct pressure   Wound exploration: wound explored through full  range of motion     Wound extent: no foreign body     Contaminated: no   Treatment:    Area cleansed with:  Soap and water   Amount of cleaning:  Standard   Irrigation solution:  Tap water   Irrigation method:  Tap   Visualized foreign bodies/material removed: no     Debridement:  None   Undermining:  None   Scar revision: no   Skin repair:    Repair method:  Sutures   Suture size:  5-0   Suture material:  Nylon   Suture technique:  Simple interrupted   Number of sutures:  2 Approximation:    Approximation:  Close Post-procedure details:    Dressing:  Adhesive bandage   Procedure completion:  Tolerated    MEDICATIONS ORDERED IN ED: Medications  lidocaine (PF) (XYLOCAINE) 1 % injection 5 mL (has no administration in time range)    Consultants:  IMPRESSION / MDM / [place] / ED COURSE  I reviewed the triage vital signs and the nursing notes.                              Differential diagnosis  includes, but is not limited to, finger laceration, tendon injury, vascular injury, fracture   MDM: Offered x-ray to this patient but she would rather repair discharge because she does not want to wait for x-ray.  I think low suspicion for her given no bony tenderness and full active range of motion.  Doubt nerve involvement given good sensation, tendon injury less likely given full active range of motion.  Laceration repaired as above.  Patient to return to PMD for suture removal and given signs of infection to look out for for return.   Patient's presentation is most consistent with acute presentation with potential threat to life or bodily function.       FINAL CLINICAL IMPRESSION(S) / ED DIAGNOSES   Final diagnoses:  Laceration of left ring finger without foreign body without damage to nail, initial encounter     Rx / DC Orders   ED Discharge Orders     None        Note:  This document was prepared using Dragon voice recognition software and may include unintentional dictation errors.    Lucillie Garfinkel, MD 07/26/22 [phone]

## 2022-07-26 NOTE — Discharge Instructions (Signed)
Take acetaminophen 650 mg and ibuprofen 400 mg every 6 hours for pain.  Take with food. Please keep your wound clean by washing at least daily with soap and water. If you see any signs of infection like spreading redness, pus coming from the wound, extreme pain, fevers, chills or any other worsening doctor right away or come back to the emergency department See your doctor or come back to the emergency department 7 days for suture removal.  Thank you for choosing [place] for your health care today!  Please see your primary doctor this week for a follow up appointment.   If you do not have a primary doctor call the following clinics to establish care:  If you have insurance:  [facility] [phone] [place] [address]   [facility]  [phone] [place] [address]   If you do not have insurance:  [facility]  [phone] [address], [place] [address]  Sometimes, in the early stages of certain disease courses it is difficult to detect in the emergency department evaluation -- so, it is important that you continue to monitor your symptoms and call your doctor right away or return to the emergency department if you develop any new or worsening symptoms.  It was my pleasure to care for you today.   Hoover Brunette Jacelyn Grip, MD

## 2022-07-26 NOTE — ED Triage Notes (Signed)
Pt reports shut her right hand, index finger in a door at work and now with laceration in the crease

## 2022-07-26 NOTE — ED Notes (Signed)
Pt verbalizes understanding of d/c instructions and follow up. 

## 2022-09-03 ENCOUNTER — Telehealth: Payer: Self-pay

## 2022-09-03 NOTE — Telephone Encounter (Signed)
Mychart msg sent. AS, CMA 

## 2022-10-08 DIAGNOSIS — F332 Major depressive disorder, recurrent severe without psychotic features: Secondary | ICD-10-CM | POA: Diagnosis not present

## 2022-10-27 DIAGNOSIS — F332 Major depressive disorder, recurrent severe without psychotic features: Secondary | ICD-10-CM | POA: Diagnosis not present

## 2022-10-29 DIAGNOSIS — F332 Major depressive disorder, recurrent severe without psychotic features: Secondary | ICD-10-CM | POA: Diagnosis not present

## 2022-11-04 ENCOUNTER — Ambulatory Visit: Payer: BC Managed Care – PPO | Admitting: Internal Medicine

## 2022-11-13 DIAGNOSIS — F332 Major depressive disorder, recurrent severe without psychotic features: Secondary | ICD-10-CM | POA: Diagnosis not present

## 2022-12-16 DIAGNOSIS — H5213 Myopia, bilateral: Secondary | ICD-10-CM | POA: Diagnosis not present

## 2022-12-24 DIAGNOSIS — F332 Major depressive disorder, recurrent severe without psychotic features: Secondary | ICD-10-CM | POA: Diagnosis not present

## 2022-12-26 ENCOUNTER — Emergency Department
Admission: EM | Admit: 2022-12-26 | Discharge: 2022-12-26 | Discharge status: Against Medical Advice | Payer: BC Managed Care – PPO

## 2022-12-27 ENCOUNTER — Other Ambulatory Visit: Payer: Self-pay

## 2022-12-27 ENCOUNTER — Emergency Department: Payer: BC Managed Care – PPO

## 2022-12-27 ENCOUNTER — Emergency Department
Admission: EM | Admit: 2022-12-27 | Discharge: 2022-12-27 | Disposition: A | Discharge status: Home / Self Care | Payer: BC Managed Care – PPO | Attending: Emergency Medicine | Admitting: Emergency Medicine

## 2022-12-27 DIAGNOSIS — J449 Chronic obstructive pulmonary disease, unspecified: Secondary | ICD-10-CM | POA: Insufficient documentation

## 2022-12-27 DIAGNOSIS — M545 Low back pain, unspecified: Secondary | ICD-10-CM | POA: Diagnosis present

## 2022-12-27 DIAGNOSIS — M5416 Radiculopathy, lumbar region: Secondary | ICD-10-CM | POA: Insufficient documentation

## 2022-12-27 MED ORDER — HYDROCODONE-ACETAMINOPHEN 5-325 MG PO TABS: 1.0000 | ORAL_TABLET | Freq: Four times a day (QID) | ORAL | 0 refills | Status: DC | PRN

## 2022-12-27 MED ORDER — METHYLPREDNISOLONE 4 MG PO TBPK: ORAL_TABLET | ORAL | 0 refills | Status: DC

## 2022-12-27 MED ORDER — LIDOCAINE 5 % EX PTCH
1.0000 | MEDICATED_PATCH | CUTANEOUS | Status: DC
  Administered 2022-12-27: 1 via TRANSDERMAL
  Filled 2022-12-27: qty 1

## 2022-12-27 MED ORDER — FENTANYL CITRATE PF 50 MCG/ML IJ SOSY
50.0000 ug | PREFILLED_SYRINGE | Freq: Once | INTRAMUSCULAR | Status: AC
  Administered 2022-12-27: 50 ug via INTRAMUSCULAR
  Filled 2022-12-27: qty 1

## 2022-12-27 MED ORDER — BACLOFEN 10 MG PO TABS: 10.0000 mg | ORAL_TABLET | Freq: Three times a day (TID) | ORAL | 0 refills | Status: AC

## 2022-12-27 NOTE — ED Provider Notes (Signed)
[facility] Provider Note    Event Date/Time   First MD Initiated Contact with Patient 12/27/22 0725     (approximate)   History   Back Pain   HPI  Monica Pham is a 52 y.o. female with history of COPD and previous back surgery greater than 10 years ago presents emergency department complaining of low back pain that radiates into her leg.  Patient states pain is radiating into the right leg.  Has severe type pain.  No loss of bowel or bladder control.  No known injury.      Physical Exam   Triage Vital Signs: ED Triage Vitals  Enc Vitals Group     BP 12/27/22 0703 (!) 151/95     Pulse Rate 12/27/22 0703 88     Resp 12/27/22 0703 16     Temp 12/27/22 0703 98.4 F (36.9 C)     Temp Source 12/27/22 0703 Oral     SpO2 12/27/22 0703 99 %     Weight 12/27/22 0704 145 lb (65.8 kg)     Height 12/27/22 0704 5\' 3"  (1.6 m)     Head Circumference --      Peak Flow --      Pain Score 12/27/22 0704 10     Pain Loc --      Pain Edu? --      Excl. in [place]? --     Most recent vital signs: Vitals:   12/27/22 0703  BP: (!) 151/95  Pulse: 88  Resp: 16  Temp: 98.4 F (36.9 C)  SpO2: 99%     General: Awake, no distress.   CV:  Good peripheral perfusion. regular rate and  rhythm Resp:  Normal effort.  Abd:  No distention.   Other:  Lumbar spine tender to palpation, 5 or 5 strength lower extremities, patient is unwilling to lie on her back so I can do straight leg raise or truly assess strength in lower extremities, neurovascular is intact   ED Results / Procedures / Treatments   Labs (all labs ordered are listed, but only abnormal results are displayed) Labs Reviewed - No data to display   EKG     RADIOLOGY CT of the lumbar spine    PROCEDURES:   Procedures   MEDICATIONS ORDERED IN ED: Medications  lidocaine (LIDODERM) 5 % 1 patch (has no administration in time range)  fentaNYL (SUBLIMAZE) injection 50 mcg (50 mcg  Intramuscular Given 12/27/22 0808)     IMPRESSION / MDM / ASSESSMENT AND PLAN / ED COURSE  I reviewed the triage vital signs and the nursing notes.                              Differential diagnosis includes, but is not limited to, lumbar radiculopathy, herniated disc, occult fracture  Patient's presentation is most consistent with exacerbation of chronic illness.   I did give the patient fentanyl 50 mcg IM  CT of the lumbar spine due to previous surgeries I do not feel an x-ray will show [place] exactly what we need  CT of the lumbar spine does show several worsening disc bulging and arthritic type changes this was independently reviewed and interpreted by me after reading the radiologist findings.  I did explain these findings to the patient.  Will put her on a Medrol Dosepak, baclofen, and Vicodin for pain.  She is to follow-up with neurosurgery.  She  will need to call for an appointment.  She was given a work note.  Discharged in stable condition.      FINAL CLINICAL IMPRESSION(S) / ED DIAGNOSES   Final diagnoses:  Lumbar radiculopathy     Rx / DC Orders   ED Discharge Orders          Ordered    methylPREDNISolone (MEDROL DOSEPAK) 4 MG TBPK tablet        12/27/22 0952    baclofen (LIORESAL) 10 MG tablet  3 times daily        12/27/22 0952    HYDROcodone-acetaminophen (NORCO/VICODIN) 5-325 MG tablet  Every 6 hours PRN        12/27/22 6045             Note:  This document was prepared using Dragon voice recognition software and may include unintentional dictation errors.    Faythe Ghee, PA-C 12/27/22 0957    Merwyn Katos, MD 12/27/22 [phone]

## 2022-12-27 NOTE — ED Triage Notes (Signed)
Pt c/o back pain x2 days ago without trauma or injury. Pt has hx if back pain and surgery. Pt denies any urinary symptoms and states the pain is radiating down her right leg.

## 2022-12-27 NOTE — Discharge Instructions (Addendum)
Follow-up with neurosurgery.  Please call for an appointment.  Take medications as prescribed.  Return if worsening

## 2023-01-05 NOTE — Progress Notes (Signed)
Referring Physician:  Merwyn Katos, MD [address] [place],  [place] [address]  Primary Physician:  Shane Crutch, [place]  History of Present Illness: 01/08/2023 [name] has a history of COPD.  She had previous back surgery in 2011.    Was seen in ED on 12/27/22 for LBP and right leg pain. Referred here for follow up.   She has 1 month history of constant LBP with pain into right groin/thigh and stops at the knee. Right leg is numb with standing. Leg pain > LBP. No left leg pain. Pain is worse with standing and walking. She feels weakness in right leg. Some improvement with sitting, but cannot sit for prolonged time.   She was given medrol dose pack, baclofen, and hydrocodone by ED. She had some relief with steroids and baclofen. Minimal relief with hydrocodone.   She smokes 1/2-1 ppd x 40 years.   Bowel/Bladder Dysfunction: none  Conservative measures:  Physical therapy: has not participated  Multimodal medical therapy including regular antiinflammatories: medrol dose pack, baclofen, hydrocodone, ibuprofen  Injections: has not received any epidural steroid injections  Past Surgery:  Lumbar surgery 2011 by Dr. Ralph Leyden in [place] has no symptoms of cervical myelopathy.  The symptoms are causing a significant impact on the patient's life.   Review of Systems:  A 10 point review of systems is negative, except for the pertinent positives and negatives detailed in the HPI.  Past Medical History: Past Medical History:  Diagnosis Date   Asthma    COPD (chronic obstructive pulmonary disease) (HCC)     Past Surgical History: Past Surgical History:  Procedure Laterality Date   BACK SURGERY     TUBAL LIGATION      Allergies: Allergies as of 01/08/2023 - Review Complete 01/08/2023  Allergen Reaction Noted   Codeine Hives 09/05/2017    Medications: Outpatient Encounter Medications as of 01/08/2023  Medication Sig   albuterol (PROVENTIL  HFA;VENTOLIN HFA) 108 (90 BASE) MCG/ACT inhaler Inhale 2 puffs into the lungs every 6 (six) hours as needed for shortness of breath.   atorvastatin (LIPITOR) 40 MG tablet Take 40 mg by mouth daily as needed.   HYDROcodone-acetaminophen (NORCO/VICODIN) 5-325 MG tablet Take 1 tablet by mouth every 6 (six) hours as needed for moderate pain.   methylPREDNISolone (MEDROL DOSEPAK) 4 MG TBPK tablet Take 6 pills on day one then decrease by 1 pill each day   SPIRIVA HANDIHALER 18 MCG inhalation capsule 1 capsule daily.   No facility-administered encounter medications on file as of 01/08/2023.    Social History: Social History   Tobacco Use   Smoking status: Every Day    Packs/day: .5    Types: Cigarettes   Smokeless tobacco: Never  Substance Use Topics   Alcohol use: No   Drug use: No    Family Medical History: Family History  Problem Relation Age of Onset   Breast cancer Neg Hx     Physical Examination: Vitals:   01/08/23 1023  BP: 134/84    General: Patient is well developed, well nourished, calm, collected, and in no apparent distress. Attention to examination is appropriate.  Respiratory: Patient is breathing without any difficulty.   NEUROLOGICAL:     Awake, alert, oriented to person, place, and time.  Speech is clear and fluent. Fund of knowledge is appropriate.   Cranial Nerves: Pupils equal round and reactive to light.  Facial tone is symmetric.    Mild right sided posterior  lumbar tenderness.   No abnormal lesions on exposed skin.   Strength: Side Biceps Triceps Deltoid Interossei Grip Wrist Ext. Wrist Flex.  R 5 5 5 5 5 5 5   L 5 5 5 5 5 5 5    Side Iliopsoas Quads Hamstring PF DF EHL  R 5 5 5 5 5 5   L 5 5 5 5 5 5    Reflexes are 2+ and symmetric at the biceps, triceps, brachioradialis, patella and achilles.   Hoffman's is absent.  Clonus is not present.   Bilateral upper and lower extremity sensation is intact to light touch, but diminished in right lateral  thigh and right medial calf.   Gait is normal.    Medical Decision Making  Imaging: CT of lumbar spine dated 12/27/22:  FINDINGS: Segmentation: Normal, the same numbering system used in 2011.   Alignment: Mild S-shaped lumbar scoliosis. Chronic straightening of lumbar lordosis. Subtle retrolisthesis of L2 on L3 is new.   Vertebrae: No acute osseous abnormality identified. Intact visible sacrum and SI joints. Chronic degenerative endplate changes at L4-L5.   Paraspinal and other soft tissues: Visible costophrenic angles are clear. Mild Aortoiliac calcified atherosclerosis. Normal caliber abdominal aorta. Otherwise negative visible noncontrast abdominal viscera.   Lumbar paraspinal soft tissues are within normal limits.   Disc levels:   T12-L1:  Negative.   L1-L2:  Negative.   L2-L3: Disc space loss. Circumferential disc bulge with evidence of at least moderate sized right foraminal disc herniation on series 5, image 43. This level was normal in 2011. No spinal stenosis. Probable severe right L2 foraminal stenosis.   L3-L4: Disc space loss. Circumferential disc bulge also with large, broad-based right foraminal disc series 5, image 64. This level was normal in 2011. superimposed up to moderate facet and ligament flavum hypertrophy. Possible mild spinal stenosis now. Moderate to severe right L3 neural foraminal stenosis.   L4-L5: Chronic disc space loss. Circumferential disc bulging and endplate spurring eccentric to the right with moderate facet and ligament flavum hypertrophy also greater on the right. Moderate bilateral L4 neural foraminal stenosis. Probably no significant spinal stenosis.   L5-S1: Mild disc bulging. Up to moderate facet and ligament flavum hypertrophy. No definite spinal stenosis. Moderate bilateral L5 foraminal stenosis.   IMPRESSION: 1. No acute osseous abnormality in the lumbar spine. Mild S-shaped lumbar scoliosis. Mild degenerative appearing  retrolisthesis of L2 on L3. 2. Bulky new disc degeneration at both L2-L3 and L3-L4 since a 2011 MRI. Bulky right foraminal disc herniations at both levels, with suspected severe foraminal stenosis. Query right L2 and L3 radiculitis. Chronic disc and posterior element degeneration at L4-L5. Up to mild degenerative lumbar spinal stenosis. 3.  Aortic Atherosclerosis (ICD10-I70.0).     Electronically Signed   By: Odessa Fleming M.D.  I have personally reviewed the images and agree with the above interpretation.  Assessment and Plan: Ms. Krauter is a pleasant 52 y.o. female has 1 month history of constant LBP with pain into right groin/thigh. No pain below the knee. She has numbness in right leg with standing. Primary pain is in right leg.   She has known lumbar spondylosis with  multilevel foraminal stenosis. Pain generator is likely retrolisthesis L2-L3 with severe right foraminal stenosis along with severe right foraminal stenosis at L3-L4.   Treatment options discussed with patient and following plan made:   - Order for physical therapy for lumbar spine at [facility]. Patient to call to schedule appointment.  - Discussed lumbar injections. She is  not interested in these.  - Small refill of hydrocodone to take for severe pain. Discussed that we would not fill this long term. PMP reviewed and is appropriate.  - Prescription for robaxin to take prn muscle spasms. Reviewed dosing and side effects. Discussed this can cause drowsiness.  - Started on neurontin 300mg  q hs to increase to bid as tolerated. Reviewed dosing and side effects at length.  - She is out of work Producer, television/film/video at [employer]) until her follow up with me. Will fill out FMLA papers.  - If no help with above, may need to consider surgery options. May need to also consider lumbar MRI.   I spent a total of 35 minutes in face-to-face and non-face-to-face activities related to this patient's care today including review of outside records,  review of imaging, review of symptoms, physical exam, discussion of differential diagnosis, discussion of treatment options, and documentation.   Thank you for involving me in the care of this patient.   Drake Leach PA-C Dept. of Neurosurgery

## 2023-01-08 ENCOUNTER — Encounter: Payer: Self-pay | Admitting: Orthopedic Surgery

## 2023-01-08 ENCOUNTER — Ambulatory Visit (INDEPENDENT_AMBULATORY_CARE_PROVIDER_SITE_OTHER): Payer: BC Managed Care – PPO | Admitting: Orthopedic Surgery

## 2023-01-08 VITALS — BP 134/84 | Ht 63.0 in | Wt 142.6 lb

## 2023-01-08 DIAGNOSIS — M48061 Spinal stenosis, lumbar region without neurogenic claudication: Secondary | ICD-10-CM

## 2023-01-08 DIAGNOSIS — M5416 Radiculopathy, lumbar region: Secondary | ICD-10-CM

## 2023-01-08 DIAGNOSIS — M4726 Other spondylosis with radiculopathy, lumbar region: Secondary | ICD-10-CM | POA: Diagnosis not present

## 2023-01-08 DIAGNOSIS — M47816 Spondylosis without myelopathy or radiculopathy, lumbar region: Secondary | ICD-10-CM

## 2023-01-08 MED ORDER — HYDROCODONE-ACETAMINOPHEN 5-325 MG PO TABS
1.0000 | ORAL_TABLET | Freq: Two times a day (BID) | ORAL | 0 refills | Status: DC | PRN
Start: 2023-01-08 — End: 2023-02-12

## 2023-01-08 MED ORDER — GABAPENTIN 300 MG PO CAPS
ORAL_CAPSULE | ORAL | 1 refills | Status: AC
Start: 2023-01-08 — End: ?

## 2023-01-08 MED ORDER — METHOCARBAMOL 500 MG PO TABS
500.0000 mg | ORAL_TABLET | Freq: Three times a day (TID) | ORAL | 0 refills | Status: DC | PRN
Start: 2023-01-08 — End: 2023-02-12

## 2023-01-08 NOTE — Patient Instructions (Signed)
It was so nice to see you today. Thank you so much for coming in.    You have wear and tear in your back and I think the right leg pain is coming from the nerves being irritated at L2-L3 and L3-L4.   I sent physical therapy orders to [facility]. You can call them at [phone] if you don't hear from them to schedule your visit.   I sent a small refill of hydrocodone to help with severe pain. Take only as needed. This can make you sleepy and/or constipated.   I also sent a prescription for methocarbamol to help with muscle spasms. Use only as needed and be careful, this can make you sleepy.   I sent gabapentin to help with nerve pain. This medication can make you sleepy or loopy. Take at night for 3 days. If no side effects, you can go up to twice a day. You need to take this everyday as it needs to build up in your system.   I will see you back in 4 weeks. Please do not hesitate to call if you have any questions or concerns. You can also message me in MyChart.   Drake Leach PA-C [phone]

## 2023-01-12 ENCOUNTER — Telehealth: Payer: Self-pay | Admitting: Orthopedic Surgery

## 2023-01-12 NOTE — Telephone Encounter (Signed)
Letter faxed.

## 2023-01-12 NOTE — Telephone Encounter (Signed)
Patient is calling that she is feeling better now. She is able to walk without pain. She is not sure if it's the medication you prescribed her or what, but she is a lot better. She wants to try and go back to work. Would you be ok with her going back to work? She has her first PT appointment today.

## 2023-01-12 NOTE — Telephone Encounter (Signed)
If her pain is better and she feels like she can return to work, then that is fine with me.   She cannot drive if she is taking hydrocodone.

## 2023-01-12 NOTE — Telephone Encounter (Signed)
I spoke with the patient and notified her of Stacys message.   I have typed up a work note that she can return to work. Can you please fax this to her job? She said the fax number is on the [facility] paperwork that is scanned in.

## 2023-01-12 NOTE — Telephone Encounter (Signed)
Thoughts?

## 2023-01-13 ENCOUNTER — Telehealth: Payer: Self-pay | Admitting: Neurosurgery

## 2023-01-13 DIAGNOSIS — M48061 Spinal stenosis, lumbar region without neurogenic claudication: Secondary | ICD-10-CM

## 2023-01-13 DIAGNOSIS — M47816 Spondylosis without myelopathy or radiculopathy, lumbar region: Secondary | ICD-10-CM

## 2023-01-13 DIAGNOSIS — M5416 Radiculopathy, lumbar region: Secondary | ICD-10-CM

## 2023-01-13 NOTE — Telephone Encounter (Signed)
Included on the initial eval from [facility], pt informed the PT practice that starting 06/01, she will have medicaid. [facility] does not accept Medicaid. Will need to send new referral out for her to a practice that does accept medicaid

## 2023-01-13 NOTE — Telephone Encounter (Signed)
Called and spoke with the patient she started at [facility] has one more visit then her BCBS expires they will send Kenny[facility] Arnold a letter, advised once we receive the letter we will send a referral to [facility] for PT. Patient agreed will wait for call from [facility]

## 2023-01-13 NOTE — Addendum Note (Signed)
Addended byDrake Leach on: 01/13/2023 05:10 PM   Modules accepted: Orders

## 2023-01-13 NOTE — Telephone Encounter (Signed)
PT order placed to [facility]

## 2023-01-25 ENCOUNTER — Ambulatory Visit: Payer: BC Managed Care – PPO | Admitting: Internal Medicine

## 2023-02-10 NOTE — Progress Notes (Signed)
Referring Physician:  Merwyn Katos, MD [address] [place],  [place] [address]  Primary Physician:  Shane Crutch, [place]  History of Present Illness: 01/08/2023 [name] has a history of COPD.  She had previous back surgery in 2011.    Last seen by me on 01/08/23 with constant LBP and right leg pain. She has known lumbar spondylosis with multilevel foraminal stenosis. Pain generator is likely retrolisthesis L2-L3 with severe right foraminal stenosis along with severe right foraminal stenosis at L3-L4.   She was sent to [facility] and then had to change to [facility] due to change in insurance. First PT visit was yesterday (02/11/23). Also given robaxin, limited hydrocodone, and neurontin.   She called a few days after her visit and was feeling better. Note given to return her back to work at her request.   She is here for follow up.   She feels like she is improving slowly. She continues with constant LBP with pain into right groin/thigh and stops at the knee. Pain is much better than it was at her last visit. She has intermittent numbness in right leg. Pain is worse with standing in one position. Doing okay at work.   She is taking neurontin and robaxin. Needs refill on robaxin.   She smokes 1/2-1 ppd x 40 years.   Bowel/Bladder Dysfunction: none  Conservative measures:  Physical therapy: did some at [facility], scheduled to [facility] on 02/11/23 Multimodal medical therapy including regular antiinflammatories: medrol dose pack, baclofen, hydrocodone, ibuprofen, robaxin, neurontin Injections: has not received any epidural steroid injections  Past Surgery:  Lumbar surgery 2011 by Dr. Ralph Leyden in [place] has no symptoms of cervical myelopathy.  The symptoms are causing a significant impact on the patient's life.   Review of Systems:  A 10 point review of systems is negative, except for the pertinent positives and negatives detailed in the HPI.  Past  Medical History: Past Medical History:  Diagnosis Date   Asthma    COPD (chronic obstructive pulmonary disease) (HCC)     Past Surgical History: Past Surgical History:  Procedure Laterality Date   BACK SURGERY     TUBAL LIGATION      Allergies: Allergies as of 01/08/2023 - Review Complete 01/08/2023  Allergen Reaction Noted   Codeine Hives 09/05/2017    Medications: Outpatient Encounter Medications as of 01/08/2023  Medication Sig   albuterol (PROVENTIL HFA;VENTOLIN HFA) 108 (90 BASE) MCG/ACT inhaler Inhale 2 puffs into the lungs every 6 (six) hours as needed for shortness of breath.   atorvastatin (LIPITOR) 40 MG tablet Take 40 mg by mouth daily as needed.   HYDROcodone-acetaminophen (NORCO/VICODIN) 5-325 MG tablet Take 1 tablet by mouth every 6 (six) hours as needed for moderate pain.   methylPREDNISolone (MEDROL DOSEPAK) 4 MG TBPK tablet Take 6 pills on day one then decrease by 1 pill each day   SPIRIVA HANDIHALER 18 MCG inhalation capsule 1 capsule daily.   No facility-administered encounter medications on file as of 01/08/2023.    Social History: Social History   Tobacco Use   Smoking status: Every Day    Packs/day: .5    Types: Cigarettes   Smokeless tobacco: Never  Substance Use Topics   Alcohol use: No   Drug use: No    Family Medical History: Family History  Problem Relation Age of Onset   Breast cancer Neg Hx     Physical Examination: Vitals:   01/08/23 1023  BP: 134/84   Awake, alert, oriented to person, place, and time.  Speech is clear and fluent. Fund of knowledge is appropriate.   Cranial Nerves: Pupils equal round and reactive to light.  Facial tone is symmetric.    Mild tenderness over right SI joint.   No abnormal lesions on exposed skin.   Strength:  Side Iliopsoas Quads Hamstring PF DF EHL  R 5 5 5 5 5 5   L 5 5 5 5 5 5    Clonus is not present.   Bilateral lower extremity sensation is intact to light touch.   Gait is normal.     Medical Decision Making None   Imaging: Assessment and Plan: Ms. Creighton is a pleasant 52 y.o. female has history of lumbar surgery in 2011.   She is improving slowly. She continues with constant LBP with pain into right groin/thigh and stops at the knee. Pain is much better than it was at her last visit. She has intermittent numbness in right leg.   She has known lumbar spondylosis with  multilevel foraminal stenosis. Pain generator is likely retrolisthesis L2-L3 with severe right foraminal stenosis along with severe right foraminal stenosis at L3-L4.   Treatment options discussed with patient and following plan made:   - Continue with PT at [facility].  - Continue on robaxin to take prn muscle spasms. Reviewed dosing and side effects. Discussed this can cause drowsiness. Refill given.  - Continue on neurotin.  - New prescription for motrin 800mg  to take tid prn. Reviewed dosing and side effects. Take with food.  - Discussed lumbar injections previously. She is not interested in these.  - She should continue to improve with time.   I spent a total of 20 minutes in face-to-face and non-face-to-face activities related to this patient's care today including review of outside records, review of imaging, review of symptoms, physical exam, discussion of differential diagnosis, discussion of treatment options, and documentation.   Drake Leach PA-C Dept. of Neurosurgery

## 2023-02-11 ENCOUNTER — Ambulatory Visit: Payer: Medicaid Other | Attending: Orthopedic Surgery

## 2023-02-11 DIAGNOSIS — M48061 Spinal stenosis, lumbar region without neurogenic claudication: Secondary | ICD-10-CM | POA: Diagnosis not present

## 2023-02-11 DIAGNOSIS — M47816 Spondylosis without myelopathy or radiculopathy, lumbar region: Secondary | ICD-10-CM | POA: Insufficient documentation

## 2023-02-11 DIAGNOSIS — M5415 Radiculopathy, thoracolumbar region: Secondary | ICD-10-CM | POA: Diagnosis present

## 2023-02-11 DIAGNOSIS — M5459 Other low back pain: Secondary | ICD-10-CM | POA: Diagnosis present

## 2023-02-11 DIAGNOSIS — M6281 Muscle weakness (generalized): Secondary | ICD-10-CM | POA: Diagnosis present

## 2023-02-11 DIAGNOSIS — M5416 Radiculopathy, lumbar region: Secondary | ICD-10-CM | POA: Diagnosis not present

## 2023-02-11 NOTE — Therapy (Signed)
OUTPATIENT PHYSICAL THERAPY THORACOLUMBAR EVALUATION   Patient Name: Monica Pham MRN: [id number] DOB:Apr 09, 1971, 52 y.o., female Today's Date: 02/11/2023  END OF SESSION:  PT End of Session - 02/11/23 0851     Visit Number 1    Number of Visits 13    Date for PT Re-Evaluation 03/25/23    PT Start Time 0805    PT Stop Time 0850    PT Time Calculation (min) 45 min    Activity Tolerance Patient tolerated treatment well    Behavior During Therapy [facility] for tasks assessed/performed             Past Medical History:  Diagnosis Date   Asthma    COPD (chronic obstructive pulmonary disease) (HCC)    Past Surgical History:  Procedure Laterality Date   BACK SURGERY     TUBAL LIGATION     There are no problems to display for this patient.   PCP: Shane Crutch PA  REFERRING PROVIDER: Drake Leach PA-C  REFERRING DIAG: [id number] (ICD-10-CM) - Lumbar spondylosis M54.16 (ICD-10-CM) - Lumbar radiculopathy M48.061 (ICD-10-CM) - Lumbar foraminal stenosis  Rationale for Evaluation and Treatment: Rehabilitation  THERAPY DIAG:  Other low back pain  Muscle weakness (generalized)  Radiculopathy, thoracolumbar region  ONSET DATE: May 2024  SUBJECTIVE:                                                                                                                                                                                           SUBJECTIVE STATEMENT: Pt is a 52 y.o. female referred to OPPT for R sided lumbar radiculopathy.  PERTINENT HISTORY:  Pt is a 52 y.o. female referred to [facility] for R sided lumbar radiculopathy. Pt reports onset of LBP one morning with gradual onset of worsening LBP. Pt woke up one morning leading to RLE numbness prompting ED visit. Pt found to have L2-L4 radiculopathy. Pt reports her LBP has mostly resolved but her RLE has radicular pain along lateral thigh and into her groin following the L2 dermatome. Denies sensation issues past the knee. Pain  worsens with prolonged standing, prolonged sitting/sitting posture. Reports having to sit onto her L side to offload RLE. Reports needing 3 weeks out of work but is back to work and states pain has improved gradually and severity of N/T is improved. Pain is generally improved with rest and medication. Pt declines B/B changes, saddle anesthesia, significant weight loss. Worst pain 10/10 NPS, Best pain 1-2/10 NPS.   PAIN:  Are you having pain? Yes: NPRS scale: 3/10 Pain location: Lateral R thigh Pain description: achey, tingling Aggravating factors: Standing, walking Relieving factors: rest, medication.  PRECAUTIONS: None  WEIGHT BEARING RESTRICTIONS: No  FALLS:  Has patient fallen in last 6 months? No  LIVING ENVIRONMENT: Lives with: lives with their family Lives in: House/apartment Stairs: Yes: External: 3 steps; none Has following equipment at home: None  OCCUPATION: Server/waitress   PLOF: Independent  PATIENT GOALS: Improve her symptoms  NEXT MD VISIT: 02/12/23  OBJECTIVE:   DIAGNOSTIC FINDINGS:  CLINICAL DATA:  Study identified as missing a report at 0912 hours on 12/27/2022.   52 year old female with persistent back pain for 2 days. Pain radiating down the right leg. No known injury.   EXAM: CT LUMBAR SPINE WITHOUT CONTRAST   TECHNIQUE: Multidetector CT imaging of the lumbar spine was performed without intravenous contrast administration. Multiplanar CT image reconstructions were also generated.   RADIATION DOSE REDUCTION: This exam was performed according to the departmental dose-optimization program which includes automated exposure control, adjustment of the mA and/or kV according to patient size and/or use of iterative reconstruction technique.   COMPARISON:  Lumbar MRI 09/13/2009.   FINDINGS: Segmentation: Normal, the same numbering system used in 2011.   Alignment: Mild S-shaped lumbar scoliosis. Chronic straightening of lumbar lordosis. Subtle  retrolisthesis of L2 on L3 is new.   Vertebrae: No acute osseous abnormality identified. Intact visible sacrum and SI joints. Chronic degenerative endplate changes at L4-L5.   Paraspinal and other soft tissues: Visible costophrenic angles are clear. Mild Aortoiliac calcified atherosclerosis. Normal caliber abdominal aorta. Otherwise negative visible noncontrast abdominal viscera.   Lumbar paraspinal soft tissues are within normal limits.   Disc levels:   T12-L1:  Negative.   L1-L2:  Negative.   L2-L3: Disc space loss. Circumferential disc bulge with evidence of at least moderate sized right foraminal disc herniation on series 5, image 43. This level was normal in 2011. No spinal stenosis. Probable severe right L2 foraminal stenosis.   L3-L4: Disc space loss. Circumferential disc bulge also with large, broad-based right foraminal disc series 5, image 64. This level was normal in 2011. superimposed up to moderate facet and ligament flavum hypertrophy. Possible mild spinal stenosis now. Moderate to severe right L3 neural foraminal stenosis.   L4-L5: Chronic disc space loss. Circumferential disc bulging and endplate spurring eccentric to the right with moderate facet and ligament flavum hypertrophy also greater on the right. Moderate bilateral L4 neural foraminal stenosis. Probably no significant spinal stenosis.   L5-S1: Mild disc bulging. Up to moderate facet and ligament flavum hypertrophy. No definite spinal stenosis. Moderate bilateral L5 foraminal stenosis.   IMPRESSION: 1. No acute osseous abnormality in the lumbar spine. Mild S-shaped lumbar scoliosis. Mild degenerative appearing retrolisthesis of L2 on L3. 2. Bulky new disc degeneration at both L2-L3 and L3-L4 since a 2011 MRI. Bulky right foraminal disc herniations at both levels, with suspected severe foraminal stenosis. Query right L2 and L3 radiculitis. Chronic disc and posterior element degeneration at  L4-L5. Up to mild degenerative lumbar spinal stenosis. 3.  Aortic Atherosclerosis (ICD10-I70.0).     Electronically Signed   By: Odessa Fleming M.D.   On: 12/27/2022 09:36      PATIENT SURVEYS:  FOTO deferred to next session  SCREENING FOR RED FLAGS: Bowel or bladder incontinence: No Spinal tumors: No Cauda equina syndrome: No Compression fracture: No Abdominal aneurysm: No  COGNITION: Overall cognitive status: Within functional limits for tasks assessed     SENSATION: Light touch: Impaired  L2 and S1 dermatomes on RLE   POSTURE: decreased lumbar lordosis and weight shift left  PALPATION: TTP  along L5-S1 paraspinals and R upper glut med. Palpable, concordant trigger point along R sided glut med.   LUMBAR ROM:   AROM eval  Flexion 60%  Extension 25%  Right lateral flexion 80%  Left lateral flexion 80%  Right rotation 100%  Left rotation 100%   (Blank rows = not tested)  LOWER EXTREMITY ROM:     Active  Right eval Left eval  Hip flexion 90 90  Hip extension Limited 50% WNL  Hip abduction    Hip adduction    Hip internal rotation    Hip external rotation    Knee flexion    Knee extension    Ankle dorsiflexion    Ankle plantarflexion    Ankle inversion    Ankle eversion     (Blank rows = not tested)  LOWER EXTREMITY MMT:    MMT Right eval Left eval  Hip flexion 3 4  Hip extension 4* 5  Hip abduction 4 4  Hip adduction 5 5  Hip internal rotation 4* 4  Hip external rotation 4* 4  Knee flexion 5 5  Knee extension 5 5  Ankle dorsiflexion 5 5  Ankle plantarflexion 5 5  Ankle inversion    Ankle eversion     (Blank rows = not tested)  LUMBAR SPECIAL TESTS:  Slump test: Positive and FABER test: Positive FADDIR: Positive bilat  FUNCTIONAL TESTS:  5 times sit to stand: 13.01 sec   TODAY'S TREATMENT:                                                                                                                              DATE: 02/11/23    Provided HEP    PATIENT EDUCATION:  Education details: HEP, Prognosis, POC Person educated: Patient Education method: Explanation, Demonstration, and Handouts Education comprehension: verbalized understanding, returned demonstration, and needs further education  HOME EXERCISE PROGRAM: Access Code: [email or web address] URL: [email or web address] Date: 02/11/2023 Prepared by: Ronnie Derby  Exercises - Supine Bridge  - 1 x daily - 7 x weekly - 3 sets - 10 reps - Supine Lower Trunk Rotation  - 1 x daily - 7 x weekly - 3 sets - 10 reps - Supine Posterior Pelvic Tilt  - 1 x daily - 7 x weekly - 3 sets - 10 reps - Standing Lumbar Spine Flexion Stretch Counter  - 1 x daily - 7 x weekly - 1 sets - 3 reps - 20 hold - Prone Press Up On Elbows  - 1 x daily - 7 x weekly - 1 sets - 3 reps - 20 hold - Standing Lumbar Extension  - 1 x daily - 7 x weekly - 1 sets - 3 reps - 20 hold  ASSESSMENT:  CLINICAL IMPRESSION: Patient is a 52 y.o. female who was seen today for physical therapy evaluation and treatment for LBP and R sided radiculopathy. Pt presents with no red flag signs/symptoms today. Pt presents with positive slump test  and decreased sensations at L2 and S1 dermatomes in RLE consistent with lumbar radiculopathy. Pt grossly with limitations in AROM in all planes but without focal weakness as evidenced by MMT. Concordant LBP appreciated with palpable trigger point at glut med on R hip. 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 Pt will benefit from skilled PT services to address these impairments and return to PLOF.  OBJECTIVE IMPAIRMENTS: decreased mobility, impaired sensation, postural dysfunction, and pain.   ACTIVITY LIMITATIONS: sitting, standing, and sleeping  PARTICIPATION LIMITATIONS: occupation  PERSONAL FACTORS: Age, Fitness, Past/current experiences, Profession, Time since onset of injury/illness/exacerbation,  and 1 comorbidity: COPD  are also affecting patient's functional outcome.   REHAB POTENTIAL: Fair Baseline history of chronic LBP and prior lumbar surgery  CLINICAL DECISION MAKING: Evolving/moderate complexity  EVALUATION COMPLEXITY: Moderate   GOALS: Goals reviewed with patient? No  SHORT TERM GOALS: Target date: 03/11/23  Pt will be independent with HEP to improve pain and job related tasks to return to PLOF. Baseline: 02/11/23: provided HEP Goal status: INITIAL  LONG TERM GOALS: Target date: 03/08/23  Pt will improve FOTO to target score to demonstrate clinically significant improvement in functional mobility. Baseline: 02/11/23: Deferred to next session Goal status: INITIAL  2.  Pt will report < 2/10 NPS with standing tasks at work to demonstrate clinically reduced LBP and RLE pain.  Baseline: 02/11/23: worst pain 4/10 NPS in the last couple weeks Goal status: INITIAL  3.  Pt will improve 5xSTS to 10 seconds of less to demonstrate clinically significant improvement in LE strength for standing and walking ADL's Baseline: 02/11/23: 13 seconds with RLE weakness Goal status: INITIAL  PLAN:  PT FREQUENCY: 1-2x/week  PT DURATION: 8 weeks  PLANNED INTERVENTIONS: Therapeutic exercises, Therapeutic activity, Neuromuscular re-education, Patient/Family education, Self Care, Joint mobilization, Joint manipulation, Stair training, Aquatic Therapy, Dry Needling, Electrical stimulation, Spinal manipulation, Spinal mobilization, Cryotherapy, Moist heat, Manual therapy, and Re-evaluation.  PLAN FOR NEXT SESSION: FOTO, review HEP, lumbar mobility and LE strength   Tienna Bienkowski M. Fairly IV, PT, DPT Physical Therapist- [place]  [facility]  02/11/2023, 12:27 PM

## 2023-02-12 ENCOUNTER — Encounter: Payer: Self-pay | Admitting: Orthopedic Surgery

## 2023-02-12 ENCOUNTER — Ambulatory Visit (INDEPENDENT_AMBULATORY_CARE_PROVIDER_SITE_OTHER): Payer: Medicaid Other | Admitting: Orthopedic Surgery

## 2023-02-12 DIAGNOSIS — M4726 Other spondylosis with radiculopathy, lumbar region: Secondary | ICD-10-CM | POA: Diagnosis not present

## 2023-02-12 DIAGNOSIS — M48061 Spinal stenosis, lumbar region without neurogenic claudication: Secondary | ICD-10-CM | POA: Diagnosis not present

## 2023-02-12 DIAGNOSIS — M47816 Spondylosis without myelopathy or radiculopathy, lumbar region: Secondary | ICD-10-CM

## 2023-02-12 DIAGNOSIS — M5416 Radiculopathy, lumbar region: Secondary | ICD-10-CM

## 2023-02-12 MED ORDER — METHOCARBAMOL 500 MG PO TABS
500.0000 mg | ORAL_TABLET | Freq: Three times a day (TID) | ORAL | 0 refills | Status: AC | PRN
Start: 2023-02-12 — End: ?

## 2023-02-12 MED ORDER — IBUPROFEN 800 MG PO TABS
800.0000 mg | ORAL_TABLET | Freq: Three times a day (TID) | ORAL | 1 refills | Status: DC | PRN
Start: 2023-02-12 — End: 2024-02-06

## 2023-02-12 NOTE — Patient Instructions (Signed)
It was so nice to see you today. Thank you so much for coming in.    I think you will continue to improve over time.   Continue with PT as scheduled.   I sent a prescription for motrin to help with pain and inflammation. Take as directed with food.   I also sent a refill of methocarbamol to help with muscle spasms. Use only as needed and be careful, this can make you sleepy.   I will see you back in 6-8 weeks. Please do not hesitate to call if you have any questions or concerns. You can also message me in MyChart.   Drake Leach PA-C [phone]

## 2023-02-18 ENCOUNTER — Ambulatory Visit: Payer: Medicaid Other

## 2023-02-24 ENCOUNTER — Ambulatory Visit: Payer: Medicaid Other

## 2023-03-03 ENCOUNTER — Ambulatory Visit: Payer: Medicaid Other

## 2023-03-18 ENCOUNTER — Other Ambulatory Visit: Payer: Self-pay | Admitting: Family Medicine

## 2023-03-18 DIAGNOSIS — Z1231 Encounter for screening mammogram for malignant neoplasm of breast: Secondary | ICD-10-CM

## 2023-03-29 ENCOUNTER — Ambulatory Visit: Payer: Medicaid Other | Attending: Orthopedic Surgery

## 2023-03-31 ENCOUNTER — Ambulatory Visit: Payer: Medicaid Other

## 2023-04-05 ENCOUNTER — Ambulatory Visit: Payer: Medicaid Other

## 2023-04-07 ENCOUNTER — Ambulatory Visit: Payer: Medicaid Other

## 2023-04-07 DIAGNOSIS — F332 Major depressive disorder, recurrent severe without psychotic features: Secondary | ICD-10-CM | POA: Diagnosis not present

## 2023-04-14 ENCOUNTER — Ambulatory Visit: Payer: Medicaid Other | Admitting: Orthopedic Surgery

## 2023-04-30 ENCOUNTER — Telehealth: Payer: Self-pay | Admitting: Family Medicine

## 2023-04-30 NOTE — Telephone Encounter (Signed)
Patient returned call and cancelled appointment.

## 2023-04-30 NOTE — Telephone Encounter (Signed)
LMOM for patient to return call. She has not started PT and is scheduled to come in for a PT follow up on Wednesday. May need to reschedule her.

## 2023-05-05 ENCOUNTER — Ambulatory Visit: Payer: Medicaid Other | Admitting: Orthopedic Surgery

## 2023-05-18 DIAGNOSIS — F332 Major depressive disorder, recurrent severe without psychotic features: Secondary | ICD-10-CM | POA: Diagnosis not present

## 2023-05-25 DIAGNOSIS — F332 Major depressive disorder, recurrent severe without psychotic features: Secondary | ICD-10-CM | POA: Diagnosis not present

## 2023-06-01 DIAGNOSIS — F332 Major depressive disorder, recurrent severe without psychotic features: Secondary | ICD-10-CM | POA: Diagnosis not present

## 2023-06-15 DIAGNOSIS — F332 Major depressive disorder, recurrent severe without psychotic features: Secondary | ICD-10-CM | POA: Diagnosis not present

## 2023-06-22 DIAGNOSIS — F332 Major depressive disorder, recurrent severe without psychotic features: Secondary | ICD-10-CM | POA: Diagnosis not present

## 2023-07-06 DIAGNOSIS — F332 Major depressive disorder, recurrent severe without psychotic features: Secondary | ICD-10-CM | POA: Diagnosis not present

## 2023-07-13 DIAGNOSIS — F332 Major depressive disorder, recurrent severe without psychotic features: Secondary | ICD-10-CM | POA: Diagnosis not present

## 2023-07-20 DIAGNOSIS — F332 Major depressive disorder, recurrent severe without psychotic features: Secondary | ICD-10-CM | POA: Diagnosis not present

## 2024-02-06 ENCOUNTER — Other Ambulatory Visit: Payer: Self-pay

## 2024-02-06 ENCOUNTER — Emergency Department

## 2024-02-06 ENCOUNTER — Emergency Department
Admission: EM | Admit: 2024-02-06 | Discharge: 2024-02-06 | Disposition: A | Discharge status: Home / Self Care | Attending: Emergency Medicine | Admitting: Emergency Medicine

## 2024-02-06 DIAGNOSIS — X501XXA Overexertion from prolonged static or awkward postures, initial encounter: Secondary | ICD-10-CM | POA: Diagnosis not present

## 2024-02-06 DIAGNOSIS — S93402A Sprain of unspecified ligament of left ankle, initial encounter: Secondary | ICD-10-CM | POA: Diagnosis not present

## 2024-02-06 DIAGNOSIS — J4489 Other specified chronic obstructive pulmonary disease: Secondary | ICD-10-CM | POA: Insufficient documentation

## 2024-02-06 MED ORDER — NAPROXEN 500 MG PO TABS: 500.0000 mg | ORAL_TABLET | Freq: Two times a day (BID) | ORAL | 2 refills | Status: AC

## 2024-02-06 NOTE — ED Triage Notes (Signed)
 Pt arrives via POV with c/o a twisted left ankle. 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 Pt was able to continue to walk on it but the pain got worse. Pt was ambulatory in triage but had difficulty putting weight on their left foot.

## 2024-02-06 NOTE — Discharge Instructions (Signed)
 Rest, ice, elevate often throughout the day.  Wear the CAM when out of bed.  Follow up with the foot and ankle specialist if not improving over the week.

## 2024-02-06 NOTE — ED Provider Notes (Signed)
 [facility] Provider Note    Event Date/Time   First MD Initiated Contact with Patient 02/06/24 1236     (approximate)   History   Ankle Injury   HPI  Monica Pham is a 53 y.o. female  with history of asthma, COPD and as listed in EMR presents to the emergency department for treatment and evaluation of left ankle pain after twist injury. She had her foot planted and twisted. She heard/felt a pop. No history of ankle sprain or fracture. Pain is on the outside of the ankle. She is able to bear weight, but it is painful. Injury occurred today just prior to arrival.      Physical Exam   Triage Vital Signs: ED Triage Vitals  Encounter Vitals Group     BP 02/06/24 1210 133/73     Girls Systolic BP Percentile --      Girls Diastolic BP Percentile --      Boys Systolic BP Percentile --      Boys Diastolic BP Percentile --      Pulse Rate 02/06/24 1210 87     Resp 02/06/24 1210 16     Temp 02/06/24 1210 98 F (36.7 C)     Temp Source 02/06/24 1210 Oral     SpO2 02/06/24 1210 98 %     Weight 02/06/24 1212 145 lb (65.8 kg)     Height 02/06/24 1212 5' 3 (1.6 m)     Head Circumference --      Peak Flow --      Pain Score 02/06/24 1211 4     Pain Loc --      Pain Education --      Exclude from Growth Chart --     Most recent vital signs: Vitals:   02/06/24 1210  BP: 133/73  Pulse: 87  Resp: 16  Temp: 98 F (36.7 C)  SpO2: 98%    General: Awake, no distress.  CV:  Good peripheral perfusion.  Resp:  Normal effort.  Abd:  No distention.  Other:     ED Results / Procedures / Treatments   Labs (all labs ordered are listed, but only abnormal results are displayed) Labs Reviewed - No data to display   EKG  Not indicated.   RADIOLOGY  Image and radiology report reviewed and interpreted by me. Radiology report consistent with the same.  X-ray of left ankle negative for acute bony abnormality.  PROCEDURES:  Critical Care  performed: No  Procedures   MEDICATIONS ORDERED IN ED:  Medications - No data to display   IMPRESSION / MDM / ASSESSMENT AND PLAN / ED COURSE   I have reviewed the triage note.  Differential diagnosis includes, but is not limited to, ankle sprain, ankle fracture.  Patient's presentation is most consistent with acute illness / injury with system symptoms.  53 year old female presents to the ER after left ankle injury. See HPI.  Image is negative for acute fracture. She will be placed in a CAM and given prescription for naprosyn . She will be given RICE instruction and advised to follow up with podiatry if not improving over the week.      FINAL CLINICAL IMPRESSION(S) / ED DIAGNOSES   Final diagnoses:  Sprain of left ankle, unspecified ligament, initial encounter     Rx / DC Orders   ED Discharge Orders          Ordered    naproxen  (NAPROSYN ) 500 MG  tablet  2 times daily with meals        02/06/24 1257             Note:  This document was prepared using Dragon voice recognition software and may include unintentional dictation errors.   Sherryle Don, FNP 02/06/24 1300    Shane Darling, MD 02/06/24 [phone]

## 2024-02-29 DIAGNOSIS — M778 Other enthesopathies, not elsewhere classified: Secondary | ICD-10-CM | POA: Diagnosis not present

## 2024-05-18 IMAGING — MG MM DIGITAL SCREENING BILAT W/ TOMO AND CAD
8 series · 9 of 24 positions shown · non-contrast
Comparison: Previous exam(s).

CLINICAL DATA: Screening.

EXAM:
DIGITAL SCREENING BILATERAL MAMMOGRAM WITH TOMOSYNTHESIS AND CAD
TECHNIQUE: Bilateral screening digital craniocaudal and mediolateral oblique
mammograms were obtained. Bilateral screening digital breast
tomosynthesis was performed. The images were evaluated with
computer-aided detection.

[R CC synth-2D]
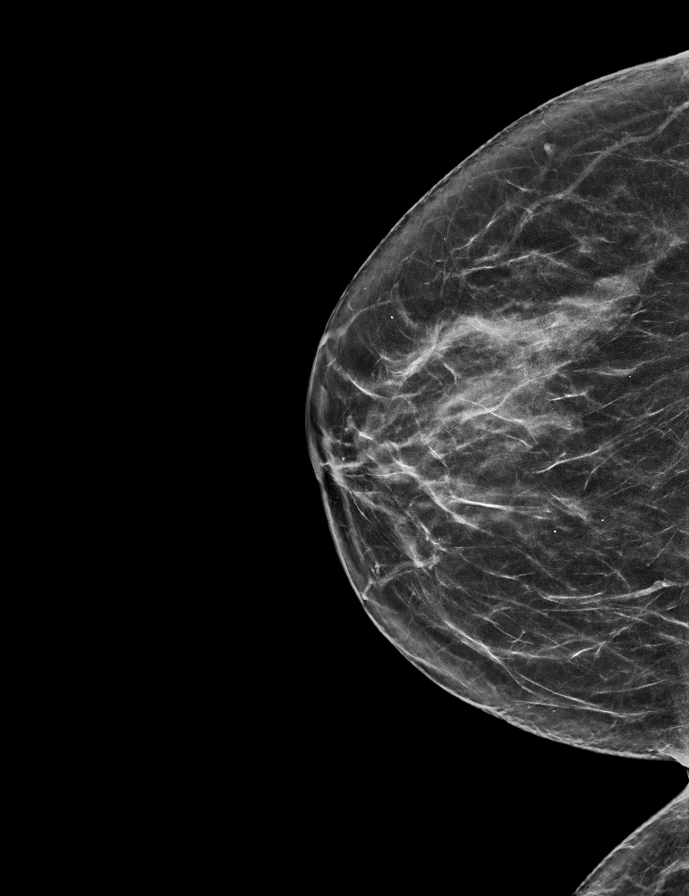

[L CC synth-2D]
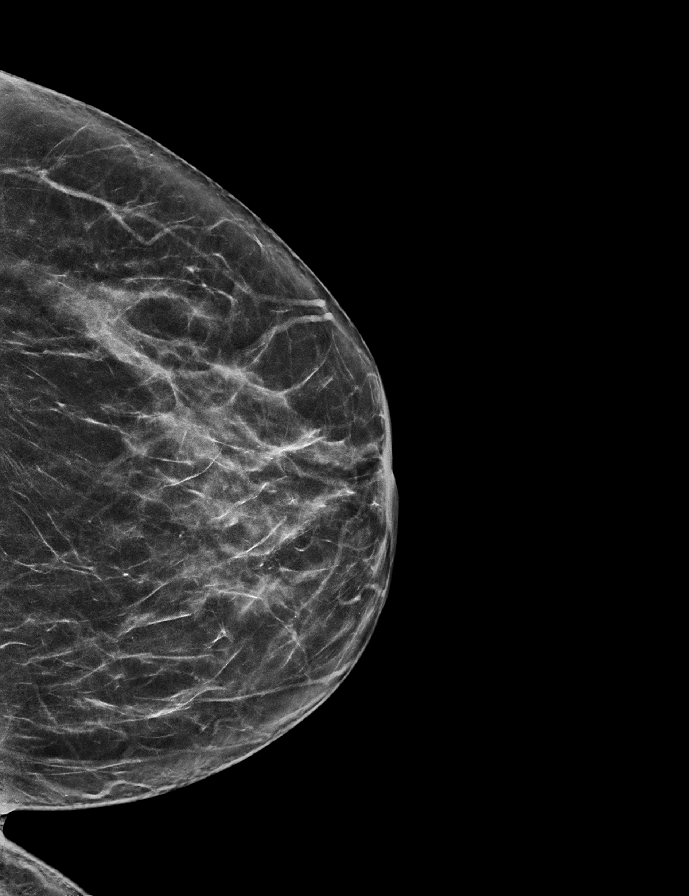

[R MLO synth-2D]
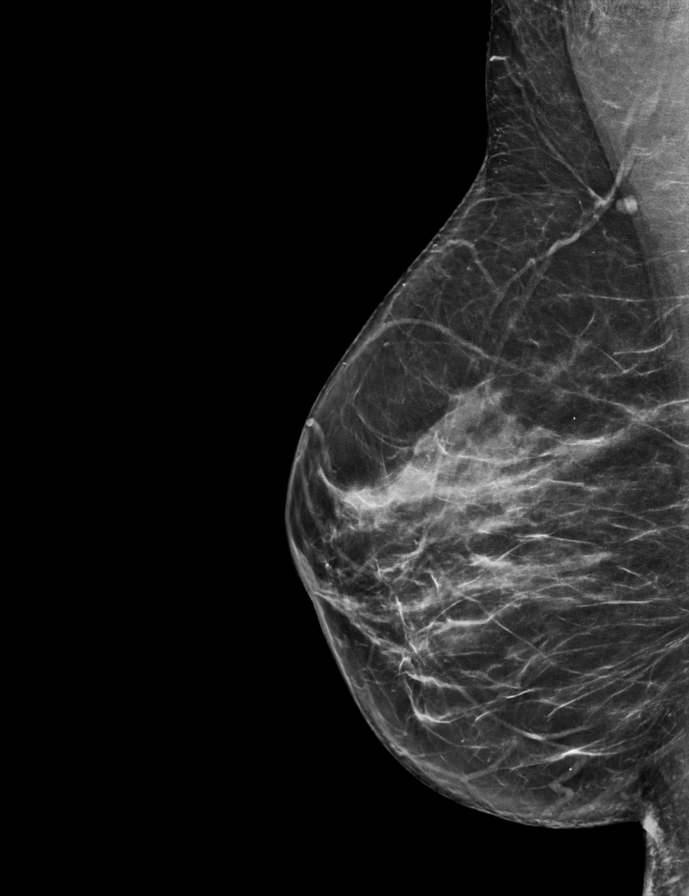

[L MLO synth-2D]
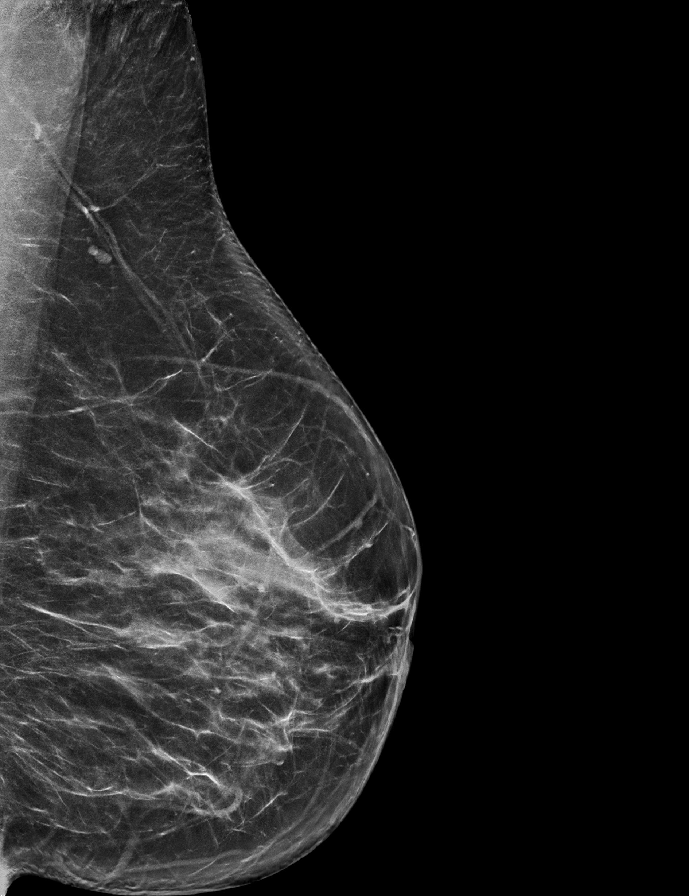

[R MLO tomo · 2 of 69 frames shown]
[frame 23/69]
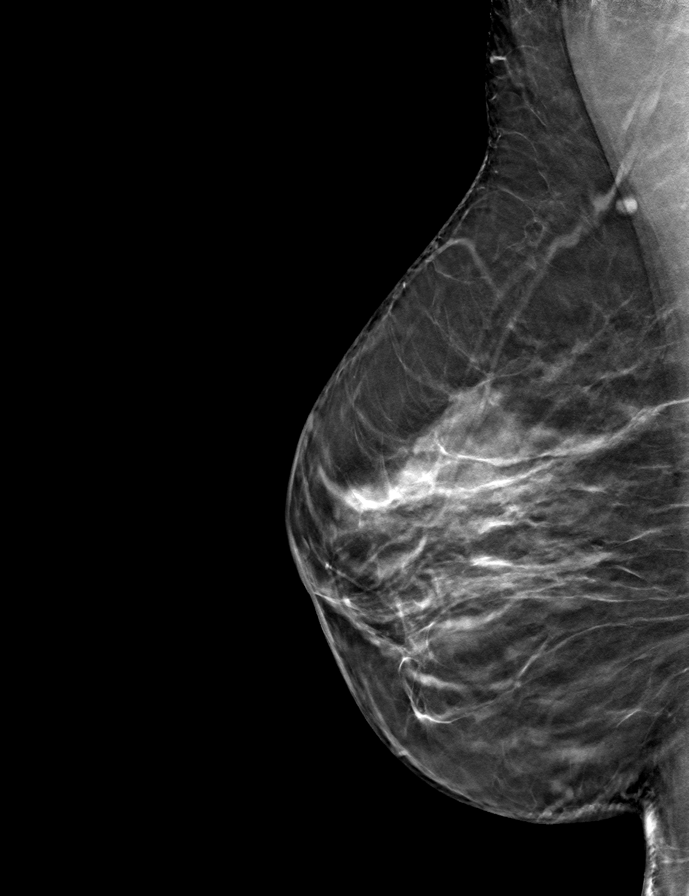
[frame 35/69]
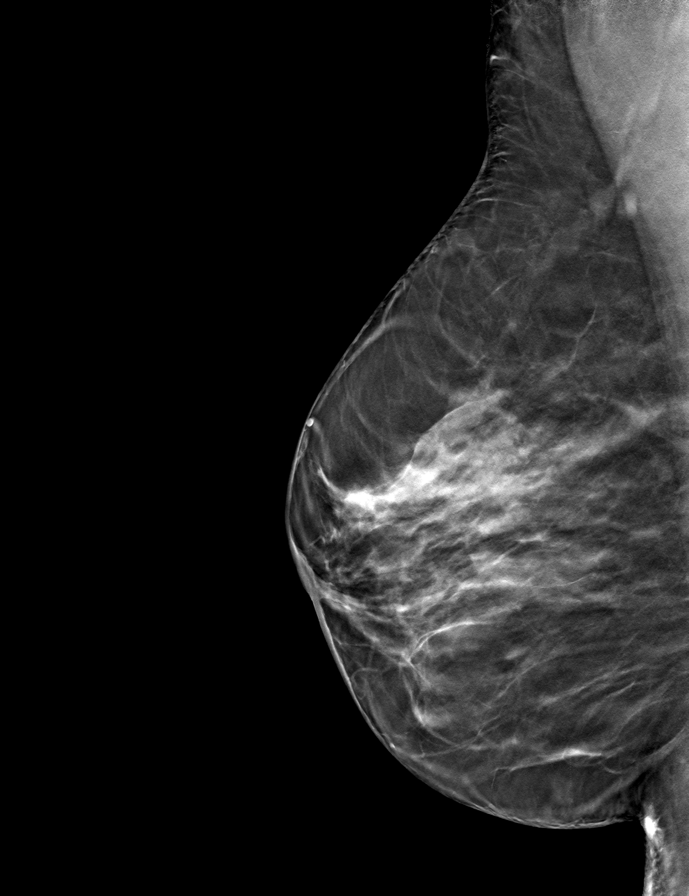

[R CC tomo · tomo slice 34/67.0]
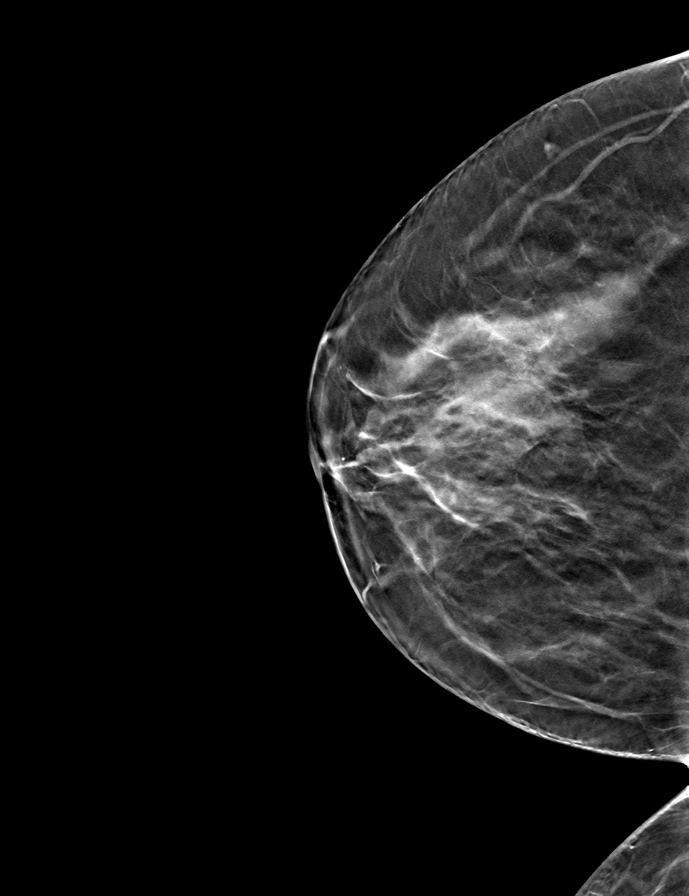

[L MLO tomo · tomo slice 37/72.0]
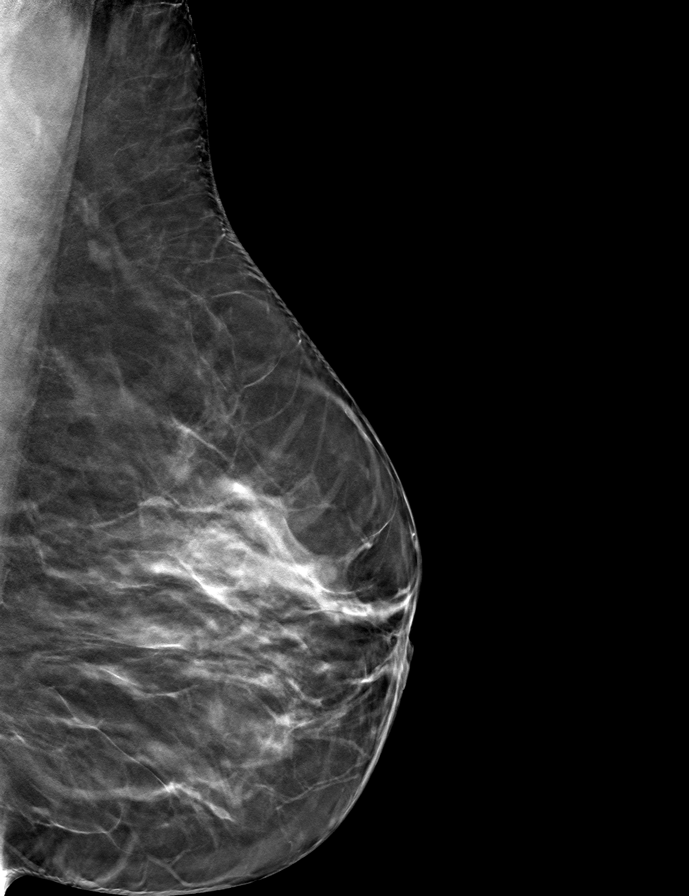

[L CC tomo · tomo slice 35/69.0]
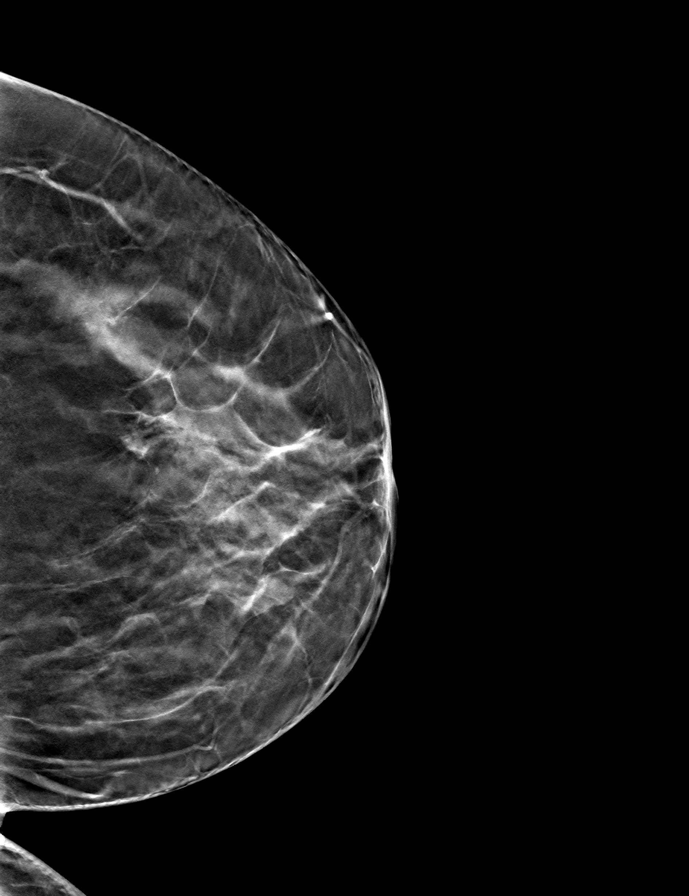

[9 of 24 positions shown; findings below may reference images not displayed]

ACR Breast Density Category b: There are scattered areas of
fibroglandular density.
FINDINGS: There are no findings suspicious for malignancy.
IMPRESSION: No mammographic evidence of malignancy. A result letter of this
screening mammogram will be mailed directly to the patient.

RECOMMENDATION:
Screening mammogram in one year. (Code:51-O-LD2)

BI-RADS CATEGORY  1: Negative.
